# Patient Record
Sex: Male | Born: 1970 | Race: White | Hispanic: No | Marital: Married | Smoking: Never smoker
Health system: Southern US, Community
[De-identification: ages and names within clinical notes are randomized; demographics above are authoritative.]

## PROBLEM LIST (undated history)

## (undated) DIAGNOSIS — K219 Gastro-esophageal reflux disease without esophagitis: Secondary | ICD-10-CM

## (undated) HISTORY — DX: Gastro-esophageal reflux disease without esophagitis: K21.9

---

## 2012-09-02 ENCOUNTER — Encounter: Payer: Self-pay | Admitting: Physician Assistant

## 2012-09-02 ENCOUNTER — Ambulatory Visit (INDEPENDENT_AMBULATORY_CARE_PROVIDER_SITE_OTHER): Payer: BC Managed Care – PPO | Admitting: Physician Assistant

## 2012-09-02 ENCOUNTER — Telehealth: Payer: Self-pay | Admitting: Radiology

## 2012-09-02 VITALS — BP 127/82 | HR 89 | Temp 98.7°F | Resp 16 | Ht 69.0 in | Wt 240.2 lb

## 2012-09-02 DIAGNOSIS — Z0000 Encounter for general adult medical examination without abnormal findings: Secondary | ICD-10-CM

## 2012-09-02 DIAGNOSIS — K219 Gastro-esophageal reflux disease without esophagitis: Secondary | ICD-10-CM

## 2012-09-02 DIAGNOSIS — R635 Abnormal weight gain: Secondary | ICD-10-CM

## 2012-09-02 DIAGNOSIS — E785 Hyperlipidemia, unspecified: Secondary | ICD-10-CM

## 2012-09-02 DIAGNOSIS — Z125 Encounter for screening for malignant neoplasm of prostate: Secondary | ICD-10-CM

## 2012-09-02 LAB — POCT CBC
HCT, POC: 48 % (ref 43.5–53.7)
Hemoglobin: 15.5 g/dL (ref 14.1–18.1)
Lymph, poc: 3.6 — AB (ref 0.6–3.4)
MCH, POC: 29.3 pg (ref 27–31.2)
MCHC: 32.3 g/dL (ref 31.8–35.4)
MPV: 9.8 fL (ref 0–99.8)
POC LYMPH PERCENT: 40.2 %L (ref 10–50)
POC MID %: 8.1 %M (ref 0–12)
WBC: 9 10*3/uL (ref 4.6–10.2)

## 2012-09-02 LAB — LIPID PANEL
Cholesterol: 246 mg/dL — ABNORMAL HIGH (ref 0–200)
Total CHOL/HDL Ratio: 7.7 Ratio
Triglycerides: 639 mg/dL — ABNORMAL HIGH (ref <150)

## 2012-09-02 LAB — COMPREHENSIVE METABOLIC PANEL
CO2: 29 mEq/L (ref 19–32)
Creat: 1.07 mg/dL (ref 0.50–1.35)
Glucose, Bld: 103 mg/dL — ABNORMAL HIGH (ref 70–99)
Total Bilirubin: 0.7 mg/dL (ref 0.3–1.2)

## 2012-09-02 NOTE — Telephone Encounter (Signed)
Pt was to have meds sent in, but not sure what, will ask Marylene Land and call him back.

## 2012-09-03 ENCOUNTER — Encounter: Payer: Self-pay | Admitting: Physician Assistant

## 2012-09-03 ENCOUNTER — Telehealth: Payer: Self-pay | Admitting: Radiology

## 2012-09-03 DIAGNOSIS — K219 Gastro-esophageal reflux disease without esophagitis: Secondary | ICD-10-CM | POA: Insufficient documentation

## 2012-09-03 MED ORDER — OMEPRAZOLE 20 MG PO CPDR: 20.0000 mg | DELAYED_RELEASE_CAPSULE | Freq: Every day | ORAL | Status: DC

## 2012-09-03 NOTE — Patient Instructions (Signed)
Healthy diet and exercise recommended as well as h/o for age given.

## 2012-09-03 NOTE — Progress Notes (Signed)
  Subjective:    Patient ID: Zachary Shelton, male    DOB: 02-24-1971, 42 y.o.   MRN: [id number]  HPI patient here for CPE.  He denies any problems currently.  His PMH, SH,SxH updated and reviewed. He admits to having had about a 30 pound weight gain over the last year and a half. He knows this is due to poor eating habits and not exercising enough. His cholesterol has been high in the past.  Review of Systems  All other systems reviewed and are negative.      Objective:   Physical Exam  Nursing note and vitals reviewed. Constitutional: He is oriented to person, place, and time. He appears well-developed and well-nourished.       Obese male  HENT:  Head: Normocephalic and atraumatic.  Right Ear: External ear normal.  Left Ear: External ear normal.  Nose: Nose normal.  Mouth/Throat: Oropharynx is clear and moist. No oropharyngeal exudate.       mallampati 2-3  Eyes: Conjunctivae normal and EOM are normal. Pupils are equal, round, and reactive to light. Right eye exhibits no discharge. Left eye exhibits no discharge.       Fundi benign  Neck: Normal range of motion. Neck supple. No JVD present. No thyromegaly present.  Cardiovascular: Normal rate, regular rhythm, normal heart sounds and intact distal pulses.  Exam reveals no gallop and no friction rub.   No murmur heard. Pulmonary/Chest: Effort normal and breath sounds normal.  Abdominal: Soft. Bowel sounds are normal.       Exam limited by obesity  Musculoskeletal: Normal range of motion.  Lymphadenopathy:    He has no cervical adenopathy.  Neurological: He is alert and oriented to person, place, and time. He has normal reflexes. No cranial nerve deficit.  Skin: Skin is warm and dry.  Psychiatric: He has a normal mood and affect. His behavior is normal.      Assessment & Plan:  CPE for age-normal exam except obesity Hypercholesterolemia-checking labs GERD-stable on omeprazole-it is fine for him to continue this at his current  dose. mallampati 2-3 and he snores.  We discussed sleep apnea.  He declines workup at this time.

## 2012-09-03 NOTE — Telephone Encounter (Signed)
I spoke to patient yesterday, he was seen ,his meds not sent in what is he to have?

## 2012-09-03 NOTE — Telephone Encounter (Signed)
Sorry, I sent it this afternoon!

## 2012-09-04 NOTE — Telephone Encounter (Signed)
Spoke to pt advised RX at pharmacy.

## 2013-05-19 ENCOUNTER — Ambulatory Visit (INDEPENDENT_AMBULATORY_CARE_PROVIDER_SITE_OTHER): Payer: BC Managed Care – PPO | Admitting: Family Medicine

## 2013-05-19 VITALS — BP 118/76 | HR 70 | Temp 98.3°F | Resp 14 | Ht 69.0 in | Wt 235.0 lb

## 2013-05-19 DIAGNOSIS — R109 Unspecified abdominal pain: Secondary | ICD-10-CM

## 2013-05-19 DIAGNOSIS — R110 Nausea: Secondary | ICD-10-CM

## 2013-05-19 DIAGNOSIS — K219 Gastro-esophageal reflux disease without esophagitis: Secondary | ICD-10-CM

## 2013-05-19 LAB — POCT CBC
Granulocyte percent: 58.6 % (ref 37–80)
HCT, POC: 41.1 % — AB (ref 43.5–53.7)
Hemoglobin: 13.1 g/dL — AB (ref 14.1–18.1)
Lymph, poc: 2.3 (ref 0.6–3.4)
MCH, POC: 29.4 pg (ref 27–31.2)
MCHC: 31.9 g/dL (ref 31.8–35.4)
MCV: 92.3 fL (ref 80–97)
MID (cbc): 0.5 (ref 0–0.9)
MPV: 9.1 fL (ref 0–99.8)
POC Granulocyte: 4 (ref 2–6.9)
POC LYMPH PERCENT: 34.2 % (ref 10–50)
POC MID %: 7.2 % (ref 0–12)
Platelet Count, POC: 192 K/uL (ref 142–424)
RBC: 4.45 M/uL — AB (ref 4.69–6.13)
RDW, POC: 13.9 %
WBC: 6.8 K/uL (ref 4.6–10.2)

## 2013-05-19 LAB — POCT UA - MICROSCOPIC ONLY
Mucus, UA: NEGATIVE
Yeast, UA: NEGATIVE

## 2013-05-19 LAB — POCT URINALYSIS DIPSTICK
Bilirubin, UA: NEGATIVE
Blood, UA: NEGATIVE
Glucose, UA: NEGATIVE
Nitrite, UA: NEGATIVE

## 2013-05-19 NOTE — Progress Notes (Deleted)
Subjective:    Patient ID: Zachary Shelton, male    DOB: [birth year], 42 y.o.   MRN: [id number]  This chart was scribed for Meredith Staggers, MD by Greggory Stallion, ED Scribe. This patient's care was started at 6:02 PM.  HPI HPI Comments: Zachary Shelton is a 42 y.o. male who presents to the office complaining of intermittent cramping abdominal pain that started 4- 5 days ago - on and off, but more cramping since last nt.  Episodes occur every 5-10 minutes, noted after dinner. He states it started with chills and nausea 4 days ago but those symptoms resolved 2 days later. Pt states he was at [facility] when the symptoms started. He states he was drinking more alcohol than normal few days prior, and had an episode of emesis because of it. In the past he would normally have 1-2 drinks per day but has cut down in the last few months. Pt has been having normal bowel movements. Pt denies difficulty urinating, hematuria, fever, chest pain, nausea, emesis, diarrhea, dark tarry stool, hematochezia. Pt denies smoking cigarettes and recreational drug use. His wife is currently sick with URI symptoms but he has no other sick contacts with GI illness.  Patient Active Problem List   Diagnosis Date Noted   GERD (gastroesophageal reflux disease) 09/03/2012   Past Medical History  Diagnosis Date   GERD (gastroesophageal reflux disease)    History reviewed. No pertinent past surgical history. No Known Allergies Prior to Admission medications   Medication Sig Start Date End Date Taking? Authorizing Provider  omeprazole (PRILOSEC) 20 MG capsule Take 1 capsule (20 mg total) by mouth daily. 09/03/12  Yes Anders Simmonds, PA-C   History   Social History   Marital Status: Married    Spouse Name: N/A    Number of Children: N/A   Years of Education: N/A   Occupational History   Not on file.   Social History Main Topics   Smoking status: Never Smoker    Smokeless tobacco: Not on file   Alcohol Use:  0.6 oz/week    1 Glasses of wine per week   Drug Use: No   Sexual Activity: Not on file   Other Topics Concern   Not on file   Social History Narrative   No narrative on file    Review of Systems  Constitutional: Negative for fever.  Cardiovascular: Negative for chest pain.  Gastrointestinal: Positive for abdominal pain. Negative for nausea, vomiting, diarrhea and blood in stool.  Genitourinary: Negative for hematuria and difficulty urinating.       Objective:   Physical Exam  Constitutional: He is oriented to person, place, and time. He appears well-developed and well-nourished. No distress.  HENT:  Head: Normocephalic and atraumatic.  Neck: Normal range of motion.  Cardiovascular: Normal rate, regular rhythm and normal heart sounds.   Pulmonary/Chest: Effort normal and breath sounds normal. No respiratory distress. He has no wheezes. He has no rales.  Abdominal: Soft. Bowel sounds are normal. There is tenderness (minima on repeat exam, no guarding. ) in the suprapubic area. There is no rigidity, no rebound, no guarding, no CVA tenderness, no tenderness at McBurney's point and negative Murphy's sign. No hernia.  Feels some soreness towards the back with palpation of abdomen.   Neurological: He is alert and oriented to person, place, and time.  Skin: Skin is warm and dry. He is not diaphoretic.  Psychiatric: He has a normal mood and affect. His behavior is normal.  Filed Vitals:   05/19/13 1656  BP: 118/76  Pulse: 70  Temp: 98.3 F (36.8 C)  Resp: 14  Height: 5\' 9"  (1.753 m)  Weight: 235 lb (106.595 kg)  SpO2: 96%       Assessment & Plan:  Zachary Shelton is a 42 y.o. male   I personally performed the services described in this documentation, which was scribed in my presence. The recorded information has been reviewed and considered, and addended by me as needed.

## 2013-05-19 NOTE — Progress Notes (Signed)
Patient ID: Zachary Shelton, male    DOB: 11/04/1970, 42 y.o.   MRN: [id number]   This chart was scribed for Meredith Staggers, MD by Greggory Stallion, ED Scribe. This patient's care was started at 6:02 PM.   HPI HPI Comments: Zachary Shelton is a 42 y.o. male who presents to the office complaining of intermittent cramping abdominal pain that started last night. Episodes occur every 5-10 minutes. He states it started with chills and nausea 4 days ago but those symptoms resolved 2 days later. Pt states he was at [facility] when the symptoms started. He states he was drinking more alcohol than normal and had an episode of emesis because of it. In the past he would normally have 1-2 drinks per day but has cut down in the last few months. Pt has been having normal bowel movements. Pt denies difficulty urinating, hematuria, fever, chest pain, nausea, emesis, diarrhea, dark tarry stool, hematochezia. Pt denies smoking cigarettes and recreational drug use. His wife is currently sick with URI symptoms but he has no other sick contacts.   Later in visit - addl history admits to, alleve, alka seltzer use, and possible blood in vomit last week.     Patient Active Problem List     Diagnosis  Date Noted   .  GERD (gastroesophageal reflux disease)  09/03/2012    Past Medical History   Diagnosis  Date   .  GERD (gastroesophageal reflux disease)      History reviewed. No pertinent past surgical history. No Known Allergies Prior to Admission medications    Medication  Sig  Start Date  End Date  Taking?  Authorizing Provider   omeprazole (PRILOSEC) 20 MG capsule  Take 1 capsule (20 mg total) by mouth daily.  09/03/12    Yes  Anders Simmonds, PA-C       History       Social History   .  Marital Status:  Married       Spouse Name:  N/A       Number of Children:  N/A   .  Years of Education:  N/A       Occupational History   .  Not on file.       Social History Main Topics   .  Smoking status:   Never Smoker    .  Smokeless tobacco:  Not on file   .  Alcohol Use:  0.6 oz/week       1 Glasses of wine per week   .  Drug Use:  No   .  Sexual Activity:  Not on file       Other Topics  Concern   .  Not on file       Social History Narrative   .  No narrative on file      Review of Systems  Constitutional: Negative for fever.  Cardiovascular: Negative for chest pain.  Gastrointestinal: Positive for abdominal pain. Negative for nausea, vomiting, diarrhea and blood in stool.  Genitourinary: Negative for hematuria and difficulty urinating.      Objective:    Physical Exam  Constitutional: He is oriented to person, place, and time. He appears well-developed and well-nourished. No distress.  HENT:   Head: Normocephalic and atraumatic.  Neck: Normal range of motion.  Cardiovascular: Normal rate, regular rhythm and normal heart sounds.   Pulmonary/Chest: Effort normal and breath sounds normal. No respiratory distress. He has no wheezes. He has no rales.  Abdominal: Soft. There is no tenderness.  Feels some soreness towards the back with palpation of abdomen.   Neurological: He is alert and oriented to person, place, and time.  Skin: Skin is warm and dry. He is not diaphoretic.  Psychiatric: He has a normal mood and affect. His behavior is normal.   Filed Vitals:   05/19/13 1656  BP: 118/76  Pulse: 70  Temp: 98.3 F (36.8 C)  Resp: 14  Height: 5\' 9"  (1.753 m)  Weight: 235 lb (106.595 kg)  SpO2: 96%       Filed Vitals:     05/19/13 1656   BP:  118/76   Pulse:  70   Temp:  98.3 F (36.8 C)   Resp:  14   Height:  5\' 9"  (1.753 m)   Weight:  235 lb (106.595 kg)   SpO2:  96%     HGB 15.5 in January. hemosure collected with rectal exam - no gross blood, minimal brown stool on test.    Results for orders placed in visit on 05/19/13  POCT CBC      Result Value Range   WBC 6.8  4.6 - 10.2 K/uL   Lymph, poc 2.3  0.6 - 3.4   POC LYMPH PERCENT 34.2  10 - 50 %L    MID (cbc) 0.5  0 - 0.9   POC MID % 7.2  0 - 12 %M   POC Granulocyte 4.0  2 - 6.9   Granulocyte percent 58.6  37 - 80 %G   RBC 4.45 (*) 4.69 - 6.13 M/uL   Hemoglobin 13.1 (*) 14.1 - 18.1 g/dL   HCT, POC 40.9 (*) 81.1 - 53.7 %   MCV 92.3  80 - 97 fL   MCH, POC 29.4  27 - 31.2 pg   MCHC 31.9  31.8 - 35.4 g/dL   RDW, POC 91.4     Platelet Count, POC 192  142 - 424 K/uL   MPV 9.1  0 - 99.8 fL  POCT UA - MICROSCOPIC ONLY      Result Value Range   WBC, Ur, HPF, POC 1-2     RBC, urine, microscopic 0-1     Bacteria, U Microscopic trace     Mucus, UA neg     Epithelial cells, urine per micros 1-2     Crystals, Ur, HPF, POC neg     Casts, Ur, LPF, POC neg     Yeast, UA neg    POCT URINALYSIS DIPSTICK      Result Value Range   Color, UA yellow     Clarity, UA clear     Glucose, UA neg     Bilirubin, UA neg     Ketones, UA neg     Spec Grav, UA 1.020     Blood, UA neg     pH, UA 6.0     Protein, UA neg     Urobilinogen, UA 1.0     Nitrite, UA neg     Leukocytes, UA Negative    IFOBT (OCCULT BLOOD)      Result Value Range   IFOBT Negative     Assessment & Plan:   Zachary Shelton is a 42 y.o. male Nausea - Plan: POCT CBC, Lipase, POCT UA - Microscopic Only, POCT urinalysis dipstick  Abdominal pain - Plan: POCT CBC, Lipase, POCT UA - Microscopic Only, POCT urinalysis dipstick, Comprehensive metabolic panel, IFOBT POC (occult bld, rslt in office)  GERD (gastroesophageal reflux  disease) - Plan: POCT CBC, Lipase, POCT UA - Microscopic Only, POCT urinalysis dipstick, Comprehensive metabolic panel  Intermittent epigastric abd pain - reassuring exam and WBC on CBC, but addl hx of NSAID use, and possible blood in initial vomitus, with slight anemia - DDX PUD, and with prior ETOH - pancreatitis, but minimal ttp on exam. Heme negative stool in office. Check lipase, CMP, increase omeprazole to 20mg  BID or 40mg  qd, and trigger avoidance as below.  Plan on recheck with possible repeat CBC in 2  days. Rtc/er precautions discussed and understanding expressed.   No orders of the defined types were placed in this encounter.   Patient Instructions  We sent off tests for pancreas and liver.   Increase the omeprazole to twice per day for now and avoid aspirin, NSAIDS and alcohol. Avoid spicy and fried foods as well. You should receive a call or letter about your lab results within the next week to 10 days, but recheck in 2 days to repeat blood count. Return to the clinic or go to the nearest emergency room if any of your symptoms worsen or new symptoms occur. Abdominal Pain Abdominal pain can be caused by many things. Your caregiver decides the seriousness of your pain by an examination and possibly blood tests and X-rays. Many cases can be observed and treated at home. Most abdominal pain is not caused by a disease and will probably improve without treatment. However, in many cases, more time must pass before a clear cause of the pain can be found. Before that point, it may not be known if you need more testing, or if hospitalization or surgery is needed. HOME CARE INSTRUCTIONS   Do not take laxatives unless directed by your caregiver.  Take pain medicine only as directed by your caregiver.  Only take over-the-counter or prescription medicines for pain, discomfort, or fever as directed by your caregiver.  Try a clear liquid diet (broth, tea, or water) for as long as directed by your caregiver. Slowly move to a bland diet as tolerated. SEEK IMMEDIATE MEDICAL CARE IF:   The pain does not go away.  You have a fever.  You keep throwing up (vomiting).  The pain is felt only in portions of the abdomen. Pain in the right side could possibly be appendicitis. In an adult, pain in the left lower portion of the abdomen could be colitis or diverticulitis.  You pass bloody or black tarry stools. MAKE SURE YOU:   Understand these instructions.  Will watch your condition.  Will get help right  away if you are not doing well or get worse. Document Released: 05/01/2005 Document Revised: 10/14/2011 Document Reviewed: 03/09/2008 [facility] Patient Information 2014 [place], [place].

## 2013-05-19 NOTE — Patient Instructions (Addendum)
We sent off tests for pancreas and liver.   Increase the omeprazole to twice per day for now and avoid aspirin, NSAIDS and alcohol. Avoid spicy and fried foods as well. You should receive a call or letter about your lab results within the next week to 10 days, but recheck in 2 days to repeat blood count. Return to the clinic or go to the nearest emergency room if any of your symptoms worsen or new symptoms occur. Abdominal Pain Abdominal pain can be caused by many things. Your caregiver decides the seriousness of your pain by an examination and possibly blood tests and X-rays. Many cases can be observed and treated at home. Most abdominal pain is not caused by a disease and will probably improve without treatment. However, in many cases, more time must pass before a clear cause of the pain can be found. Before that point, it may not be known if you need more testing, or if hospitalization or surgery is needed. HOME CARE INSTRUCTIONS   Do not take laxatives unless directed by your caregiver.  Take pain medicine only as directed by your caregiver.  Only take over-the-counter or prescription medicines for pain, discomfort, or fever as directed by your caregiver.  Try a clear liquid diet (broth, tea, or water) for as long as directed by your caregiver. Slowly move to a bland diet as tolerated. SEEK IMMEDIATE MEDICAL CARE IF:   The pain does not go away.  You have a fever.  You keep throwing up (vomiting).  The pain is felt only in portions of the abdomen. Pain in the right side could possibly be appendicitis. In an adult, pain in the left lower portion of the abdomen could be colitis or diverticulitis.  You pass bloody or black tarry stools. MAKE SURE YOU:   Understand these instructions.  Will watch your condition.  Will get help right away if you are not doing well or get worse. Document Released: 05/01/2005 Document Revised: 10/14/2011 Document Reviewed: 03/09/2008 [facility] Patient  Information 2014 [employer], [place].

## 2013-05-20 LAB — COMPREHENSIVE METABOLIC PANEL
ALT: 34 U/L (ref 0–53)
AST: 20 U/L (ref 0–37)
Albumin: 4.6 g/dL (ref 3.5–5.2)
Alkaline Phosphatase: 62 U/L (ref 39–117)
BUN: 15 mg/dL (ref 6–23)
Creat: 0.95 mg/dL (ref 0.50–1.35)
Glucose, Bld: 94 mg/dL (ref 70–99)
Total Bilirubin: 0.4 mg/dL (ref 0.3–1.2)
Total Protein: 6.9 g/dL (ref 6.0–8.3)

## 2013-05-20 LAB — LIPASE: Lipase: 26 U/L (ref 0–75)

## 2013-05-21 ENCOUNTER — Telehealth: Payer: Self-pay

## 2013-05-21 ENCOUNTER — Ambulatory Visit: Payer: BC Managed Care – PPO

## 2013-05-21 NOTE — Telephone Encounter (Signed)
DR Neva Seat   PATIENT WOULD LIKE TO TALK TO YOU REGARDING HIS LABS PLEASE CALL HIM AT [phone]

## 2013-05-21 NOTE — Telephone Encounter (Signed)
Left message, what are his questions?

## 2013-05-22 ENCOUNTER — Ambulatory Visit: Payer: BC Managed Care – PPO | Admitting: Family Medicine

## 2013-05-22 VITALS — BP 102/70 | HR 63 | Temp 98.0°F | Resp 16 | Ht 69.0 in | Wt 235.0 lb

## 2013-05-22 DIAGNOSIS — D649 Anemia, unspecified: Secondary | ICD-10-CM

## 2013-05-22 LAB — POCT CBC
Granulocyte percent: 64.8 %G (ref 37–80)
HCT, POC: 45 % (ref 43.5–53.7)
Hemoglobin: 14.6 g/dL (ref 14.1–18.1)
Lymph, poc: 3 (ref 0.6–3.4)
MCH, POC: 30 pg (ref 27–31.2)
MCHC: 32.4 g/dL (ref 31.8–35.4)
MCV: 92.4 fL (ref 80–97)
MID (cbc): 0.5 (ref 0–0.9)
MPV: 9.2 fL (ref 0–99.8)
POC Granulocyte: 6.5 (ref 2–6.9)
POC LYMPH PERCENT: 29.7 %L (ref 10–50)
POC MID %: 5.5 %M (ref 0–12)
Platelet Count, POC: 255 10*3/uL (ref 142–424)
RBC: 4.87 M/uL (ref 4.69–6.13)
RDW, POC: 13.5 %
WBC: 10 10*3/uL (ref 4.6–10.2)

## 2013-05-22 NOTE — Telephone Encounter (Signed)
Pt currently here to be seen

## 2013-05-22 NOTE — Progress Notes (Signed)
  Subjective:  This chart was scribed for Zachary Sidle, MD by Arlan Organ, ED Scribe. This patient was seen in room Room 8 and the patient's care was started 9:02 AM.   Patient ID: Zachary Shelton, male    DOB: [birth year], 42 y.o.   MRN: [id number]  HPI HPI Comments: Zachary Shelton is a 42 y.o. male who presents to [facility] seeking follow up for his anemia today. Pt states he was here at [facility] on 10/15 for abdominal pain, and was told he had anemia. Pt states he was told to come in for a follow up per Dr. Neva Seat. 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   Pt requesting phone call for results if out of range.   Review of Systems Neg for fever, pain, weakness or fatigue    Objective:   Physical Exam  Patient in no distress Results for orders placed in visit on 05/22/13  POCT CBC      Result Value Range   WBC 10.0  4.6 - 10.2 K/uL   Lymph, poc 3.0  0.6 - 3.4   POC LYMPH PERCENT 29.7  10 - 50 %L   MID (cbc) 0.5  0 - 0.9   POC MID % 5.5  0 - 12 %M   POC Granulocyte 6.5  2 - 6.9   Granulocyte percent 64.8  37 - 80 %G   RBC 4.87  4.69 - 6.13 M/uL   Hemoglobin 14.6  14.1 - 18.1 g/dL   HCT, POC 40.9  81.1 - 53.7 %   MCV 92.4  80 - 97 fL   MCH, POC 30.0  27 - 31.2 pg   MCHC 32.4  31.8 - 35.4 g/dL   RDW, POC 91.4     Platelet Count, POC 255  142 - 424 K/uL   MPV 9.2  0 - 99.8 fL       Assessment & Plan:  Mild anemia, transient, resolved.  No further action necessary

## 2013-09-23 ENCOUNTER — Ambulatory Visit (INDEPENDENT_AMBULATORY_CARE_PROVIDER_SITE_OTHER): Payer: BC Managed Care – PPO | Admitting: Family Medicine

## 2013-09-23 ENCOUNTER — Encounter: Payer: Self-pay | Admitting: Family Medicine

## 2013-09-23 VITALS — BP 128/82 | HR 76 | Temp 98.3°F | Resp 16 | Ht 68.0 in | Wt 235.4 lb

## 2013-09-23 DIAGNOSIS — Z0000 Encounter for general adult medical examination without abnormal findings: Secondary | ICD-10-CM

## 2013-09-23 DIAGNOSIS — K219 Gastro-esophageal reflux disease without esophagitis: Secondary | ICD-10-CM

## 2013-09-23 DIAGNOSIS — E785 Hyperlipidemia, unspecified: Secondary | ICD-10-CM

## 2013-09-23 DIAGNOSIS — E669 Obesity, unspecified: Secondary | ICD-10-CM | POA: Insufficient documentation

## 2013-09-23 DIAGNOSIS — E781 Pure hyperglyceridemia: Secondary | ICD-10-CM | POA: Insufficient documentation

## 2013-09-23 LAB — COMPREHENSIVE METABOLIC PANEL
ALBUMIN: 5 g/dL (ref 3.5–5.2)
ALK PHOS: 48 U/L (ref 39–117)
ALT: 36 U/L (ref 0–53)
AST: 21 U/L (ref 0–37)
BUN: 18 mg/dL (ref 6–23)
CALCIUM: 9.3 mg/dL (ref 8.4–10.5)
CO2: 27 meq/L (ref 19–32)
Chloride: 100 mEq/L (ref 96–112)
Creat: 0.94 mg/dL (ref 0.50–1.35)
Glucose, Bld: 96 mg/dL (ref 70–99)
POTASSIUM: 4.2 meq/L (ref 3.5–5.3)
SODIUM: 137 meq/L (ref 135–145)
TOTAL PROTEIN: 7.3 g/dL (ref 6.0–8.3)
Total Bilirubin: 0.4 mg/dL (ref 0.2–1.2)

## 2013-09-23 LAB — POCT URINALYSIS DIPSTICK
BILIRUBIN UA: NEGATIVE
Blood, UA: NEGATIVE
Glucose, UA: NEGATIVE
LEUKOCYTES UA: NEGATIVE
NITRITE UA: NEGATIVE
PH UA: 5.5
Spec Grav, UA: 1.015
Urobilinogen, UA: 0.2

## 2013-09-23 LAB — LIPID PANEL
CHOLESTEROL: 208 mg/dL — AB (ref 0–200)
HDL: 31 mg/dL — ABNORMAL LOW (ref >39)
Total CHOL/HDL Ratio: 6.7 Ratio
Triglycerides: 544 mg/dL — ABNORMAL HIGH (ref <150)

## 2013-09-23 LAB — POCT GLYCOSYLATED HEMOGLOBIN (HGB A1C): HEMOGLOBIN A1C: 5.2

## 2013-09-23 MED ORDER — OMEPRAZOLE 20 MG PO CPDR: 20.0000 mg | DELAYED_RELEASE_CAPSULE | Freq: Every day | ORAL | Status: DC

## 2013-09-23 NOTE — Progress Notes (Signed)
Physical examination:  History: 43 year old man who is here for his annual physical exam. He needs a form completed for his job place stating that he has had a physical. He has no major acute medical complaints.  Past medical history: Operations: None Medical illnesses: No major illnesses. Has a history of some GERD and of high lipids, both cholesterol and triglycerides Medication allergies: None Medications: Omeprazole  Family history: Parents are both living and well. He has 2 brothers who are also living and well. No major familial disease reported  Social history: Patient is married and has 2 young children. 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 He has college education. He is not smoke or use drugs. He does drink 4 or 5 drinks a week. He gets some exercise with sporting and other more in the warmer weather. He does some walking of his dog.  Review of systems: Constitutional: Unremarkable HEENT: Unremarkable Respiratory: Unremarkable Cardiovascular: Unremarkable Gastrointestinal: GERD, fairly well controlled if he watches what he said. He has been working on eating less and try to lose some weight Genitourinary: Unremarkable Endocrine: Unremarkable Musculoskeletal: Unremarkable Dermatologic: Unremarkable Immunologic: Unremarkable Neurologic: Unremarkable Hematologic: Unremarkable Psychiatric: Unremarkable  Physical exam: Moderately overweight white male in no major distress. TMs are normal. Eyes PERR L. EOMs intact. Throat clear. Neck supple without nodes or thyromegaly. No carotid bruits. Chest is clear to auscultation. Heart regular without murmurs. Abdomen soft without masses or tenderness. Normal male external genitalia. No testicular masses. No hernias. Digital rectal exam is reveals prostate gland to be normal in size and contour. He has a small skin tag to the left side of his anus on the buttock. Skin otherwise fairly unremarkable. Pedal pulses are  good.  Assessment: Normal annual physical examination Overweight Hyperlipidemic History of GERD  Plan: Check hemoglobin A1c, urinalysis, CMP, lipids Return annually Wil decide if he needs to go on lipid-lowering medication after get the results of his labs back. Urged him to keep working harder on his weight loss and regular exercise and eating less Refill his omeprazole   Results for orders placed in visit on 09/23/13  POCT GLYCOSYLATED HEMOGLOBIN (HGB A1C)      Result Value Ref Range   Hemoglobin A1C 5.2    POCT URINALYSIS DIPSTICK      Result Value Ref Range   Color, UA yellow     Clarity, UA clear     Glucose, UA neg     Bilirubin, UA neg     Ketones, UA 15mg      Spec Grav, UA 1.015     Blood, UA neg     pH, UA 5.5     Protein, UA Trace     Urobilinogen, UA 0.2     Nitrite, UA neg     Leukocytes, UA Negative

## 2013-09-23 NOTE — Patient Instructions (Signed)
Getting exercise and working yet weight loss as discussed  I will let you know the results of your labs  Return if problems, otherwise annual checkup unless told otherwise after we receive your labs.

## 2013-09-26 ENCOUNTER — Other Ambulatory Visit: Payer: Self-pay | Admitting: Physician Assistant

## 2015-03-06 ENCOUNTER — Ambulatory Visit (INDEPENDENT_AMBULATORY_CARE_PROVIDER_SITE_OTHER): Payer: BLUE CROSS/BLUE SHIELD | Admitting: Family Medicine

## 2015-03-06 VITALS — BP 126/84 | HR 74 | Temp 98.8°F | Resp 16 | Ht 68.0 in | Wt 244.4 lb

## 2015-03-06 DIAGNOSIS — S3730XA Unspecified injury of urethra, initial encounter: Secondary | ICD-10-CM | POA: Diagnosis not present

## 2015-03-06 DIAGNOSIS — R300 Dysuria: Secondary | ICD-10-CM | POA: Diagnosis not present

## 2015-03-06 LAB — POCT URINALYSIS DIPSTICK
Bilirubin, UA: NEGATIVE
GLUCOSE UA: NEGATIVE
Ketones, UA: NEGATIVE
Leukocytes, UA: NEGATIVE
Nitrite, UA: NEGATIVE
Protein, UA: NEGATIVE
Spec Grav, UA: 1.02
Urobilinogen, UA: 1
pH, UA: 7

## 2015-03-06 LAB — POCT UA - MICROSCOPIC ONLY
CASTS, UR, LPF, POC: NEGATIVE
CRYSTALS, UR, HPF, POC: NEGATIVE
Mucus, UA: NEGATIVE
Yeast, UA: NEGATIVE

## 2015-03-06 MED ORDER — LIDOCAINE 5 % EX OINT: 1.0000 "application " | TOPICAL_OINTMENT | CUTANEOUS | Status: DC | PRN

## 2015-03-06 MED ORDER — OXYCODONE-ACETAMINOPHEN 5-325 MG PO TABS: 1.0000 | ORAL_TABLET | Freq: Three times a day (TID) | ORAL | Status: DC | PRN

## 2015-03-06 NOTE — Patient Instructions (Signed)

## 2015-03-06 NOTE — Progress Notes (Signed)
Subjective:    Patient ID: Zachary Shelton, male    DOB: May 11, 1971, 44 y.o.   MRN: [id number] This chart was scribed for Zachary Sorenson, MD by Jolene Provost, Medical Scribe. This patient was seen in Room 13 and the patient's care was started a 9:46 PM.  Chief Complaint  Patient presents with  . Dysuria    today    HPI HPI Comments: Zachary Shelton is a 44 y.o. male who presents to [facility] complaining of severe pain with urination that started suddenly this afternoon. Pt denies flank pain, rash, abdominal pain, urinary hesitancy, nausea, vomiting, fever or chills. Urine darker in color. Pt has not drank much water. Pt has not had STI testing. Pt is in a monogamous relationship. Pt denies nocturia. Pt's bowel movements have been normal. No family hx of kidney stones.    Past Medical History  Diagnosis Date  . GERD (gastroesophageal reflux disease)    No Known Allergies Current Outpatient Prescriptions on File Prior to Visit  Medication Sig Dispense Refill  . omeprazole (PRILOSEC) 20 MG capsule Take 1 capsule (20 mg total) by mouth daily. 90 capsule 3   No current facility-administered medications on file prior to visit.   Depression screen PHQ 2/9 03/06/2015  Decreased Interest 0  Down, Depressed, Hopeless 0  PHQ - 2 Score 0      Review of Systems  Constitutional: Negative for fever, chills, activity change, appetite change and unexpected weight change.  Gastrointestinal: Negative for nausea, vomiting, abdominal pain, diarrhea, constipation, abdominal distention and rectal pain.  Endocrine: Negative for polydipsia, polyphagia and polyuria.  Genitourinary: Positive for dysuria, hematuria, genital sores and penile pain. Negative for urgency, frequency, flank pain, decreased urine volume, discharge, penile swelling, scrotal swelling, enuresis, difficulty urinating and testicular pain.  Musculoskeletal: Negative for back pain.  Hematological: Negative for adenopathy.    Psychiatric/Behavioral: Negative for dysphoric mood.       Objective:  BP 126/84 mmHg  Pulse 74  Temp(Src) 98.8 F (37.1 C) (Oral)  Resp 16  Ht  (1.727 m)  Wt 244 lb 6.4 oz (110.859 kg)  BMI 37.17 kg/m2  SpO2 98%  Physical Exam  Constitutional: He is oriented to person, place, and time. He appears well-developed and well-nourished. No distress.  HENT:  Head: Normocephalic and atraumatic.  Eyes: Pupils are equal, round, and reactive to light.  Neck: Neck supple.  Cardiovascular: Normal rate, regular rhythm and normal heart sounds.   No murmur heard. Normal S1-S2.  Pulmonary/Chest: Effort normal and breath sounds normal. No respiratory distress. He has no wheezes.  Abdominal: Soft. Bowel sounds are normal. There is no tenderness.  No CVA tenderness  Genitourinary: Prostate normal. Rectal exam shows no external hemorrhoid, no internal hemorrhoid, no mass and anal tone normal. Prostate is not enlarged and not tender. Circumcised. No phimosis, paraphimosis, hypospadias or penile tenderness. No discharge found.  Bony pelvis normal, no inguinal tenderness, no  adenopathy. No hepatosplenomegaly. Urethra mildly irritated at 7 o'clock, abrasion at 9 o'clock. Rectum and prostate normal.  Musculoskeletal: Normal range of motion.  Neurological: He is alert and oriented to person, place, and time. Coordination normal.  Skin: Skin is warm and dry. He is not diaphoretic.  Psychiatric: He has a normal mood and affect. His behavior is normal.  Nursing note and vitals reviewed.      Results for orders placed or performed in visit on 03/06/15  POCT UA - Microscopic Only  Result Value Ref Range   WBC, Ur,  HPF, POC 1-2    RBC, urine, microscopic 4-6    Bacteria, U Microscopic trace    Mucus, UA neg    Epithelial cells, urine per micros 1-3    Crystals, Ur, HPF, POC neg    Casts, Ur, LPF, POC neg    Yeast, UA neg   POCT urinalysis dipstick  Result Value Ref Range   Color, UA yellow     Clarity, UA clear    Glucose, UA neg    Bilirubin, UA neg    Ketones, UA neg    Spec Grav, UA 1.020    Blood, UA moderate    pH, UA 7.0    Protein, UA neg    Urobilinogen, UA 1.0    Nitrite, UA neg    Leukocytes, UA Negative Negative    Assessment & Plan:   1. Dysuria   2. Urethral injury, initial encounter - little abrasion immed inside urethra visible.  unsure of etiology - can't image anything other than physical abrasion (cauht in zipper or similar?????) or could be from passed kidney stone but seems less likely as pt did not have any renal collic or any sxs at all of a stone. Still will have pt strain urine as if he did find some sediment that would help make dx..  .   Increase water.  May need to consider spraying water onto urethra while voiding to help with pain.   Can try freq top lidocaine gel.  If sxs persist after 2-3d or worsen at all, RTC for further eval and cons urology referral    Orders Placed This Encounter  Procedures  . Urine culture  . GC/Chlamydia Probe Amp  . POCT UA - Microscopic Only  . POCT urinalysis dipstick    Meds ordered this encounter  Medications  . oxyCODONE-acetaminophen (ROXICET) 5-325 MG per tablet    Sig: Take 1 tablet by mouth every 8 (eight) hours as needed for severe pain.    Dispense:  15 tablet    Refill:  0  . lidocaine (XYLOCAINE) 5 % ointment    Sig: Apply 1 application topically every 4 (four) hours as needed.    Dispense:  50 g    Refill:  0    I personally performed the services described in this documentation, which was scribed in my presence. The recorded information has been reviewed and considered, and addended by me as needed.  Zachary Sorenson, MD MPH

## 2015-03-08 LAB — GC/CHLAMYDIA PROBE AMP
CT Probe RNA: NEGATIVE
GC Probe RNA: NEGATIVE

## 2015-03-08 LAB — URINE CULTURE
COLONY COUNT: NO GROWTH
Organism ID, Bacteria: NO GROWTH

## 2015-03-12 ENCOUNTER — Encounter: Payer: Self-pay | Admitting: Family Medicine

## 2015-09-07 ENCOUNTER — Other Ambulatory Visit: Payer: Self-pay | Admitting: Family Medicine

## 2015-09-07 ENCOUNTER — Ambulatory Visit (INDEPENDENT_AMBULATORY_CARE_PROVIDER_SITE_OTHER): Payer: BLUE CROSS/BLUE SHIELD | Admitting: Family Medicine

## 2015-09-07 ENCOUNTER — Encounter: Payer: Self-pay | Admitting: Family Medicine

## 2015-09-07 VITALS — BP 130/80 | HR 99 | Temp 99.2°F | Resp 16 | Ht 68.5 in | Wt 241.4 lb

## 2015-09-07 DIAGNOSIS — Z113 Encounter for screening for infections with a predominantly sexual mode of transmission: Secondary | ICD-10-CM | POA: Diagnosis not present

## 2015-09-07 DIAGNOSIS — Z1329 Encounter for screening for other suspected endocrine disorder: Secondary | ICD-10-CM | POA: Diagnosis not present

## 2015-09-07 DIAGNOSIS — Z1383 Encounter for screening for respiratory disorder NEC: Secondary | ICD-10-CM | POA: Diagnosis not present

## 2015-09-07 DIAGNOSIS — Z130 Encounter for screening for diseases of the blood and blood-forming organs and certain disorders involving the immune mechanism: Secondary | ICD-10-CM

## 2015-09-07 DIAGNOSIS — K219 Gastro-esophageal reflux disease without esophagitis: Secondary | ICD-10-CM | POA: Diagnosis not present

## 2015-09-07 DIAGNOSIS — E669 Obesity, unspecified: Secondary | ICD-10-CM | POA: Diagnosis not present

## 2015-09-07 DIAGNOSIS — Z1389 Encounter for screening for other disorder: Secondary | ICD-10-CM | POA: Diagnosis not present

## 2015-09-07 DIAGNOSIS — E781 Pure hyperglyceridemia: Secondary | ICD-10-CM | POA: Diagnosis not present

## 2015-09-07 DIAGNOSIS — Z0000 Encounter for general adult medical examination without abnormal findings: Secondary | ICD-10-CM

## 2015-09-07 DIAGNOSIS — Z136 Encounter for screening for cardiovascular disorders: Secondary | ICD-10-CM

## 2015-09-07 LAB — POC MICROSCOPIC URINALYSIS (UMFC): Mucus: ABSENT

## 2015-09-07 LAB — COMPREHENSIVE METABOLIC PANEL
ALT: 41 U/L (ref 9–46)
AST: 22 U/L (ref 10–40)
Albumin: 4.8 g/dL (ref 3.6–5.1)
Alkaline Phosphatase: 48 U/L (ref 40–115)
BUN: 20 mg/dL (ref 7–25)
CALCIUM: 9.4 mg/dL (ref 8.6–10.3)
CO2: 28 mmol/L (ref 20–31)
Chloride: 103 mmol/L (ref 98–110)
Creat: 0.97 mg/dL (ref 0.60–1.35)
GLUCOSE: 108 mg/dL — AB (ref 65–99)
POTASSIUM: 4.2 mmol/L (ref 3.5–5.3)
Sodium: 141 mmol/L (ref 135–146)
Total Bilirubin: 0.6 mg/dL (ref 0.2–1.2)
Total Protein: 7.3 g/dL (ref 6.1–8.1)

## 2015-09-07 LAB — CBC
HCT: 43.7 % (ref 39.0–52.0)
Hemoglobin: 15.4 g/dL (ref 13.0–17.0)
MCH: 30.6 pg (ref 26.0–34.0)
MCHC: 35.2 g/dL (ref 30.0–36.0)
MCV: 86.7 fL (ref 78.0–100.0)
MPV: 10 fL (ref 8.6–12.4)
Platelets: 235 10*3/uL (ref 150–400)
RBC: 5.04 MIL/uL (ref 4.22–5.81)
RDW: 13.3 % (ref 11.5–15.5)
WBC: 8.3 10*3/uL (ref 4.0–10.5)

## 2015-09-07 LAB — LIPID PANEL
Cholesterol: 209 mg/dL — ABNORMAL HIGH (ref 125–200)
HDL: 31 mg/dL — AB (ref >=40)
LDL Cholesterol: 122 mg/dL (ref <130)
TRIGLYCERIDES: 282 mg/dL — AB (ref <150)
Total CHOL/HDL Ratio: 6.7 Ratio — ABNORMAL HIGH (ref <=5.0)
VLDL: 56 mg/dL — AB (ref <30)

## 2015-09-07 LAB — POCT URINALYSIS DIP (MANUAL ENTRY)
BILIRUBIN UA: NEGATIVE
BILIRUBIN UA: NEGATIVE
Blood, UA: NEGATIVE
Glucose, UA: NEGATIVE
Leukocytes, UA: NEGATIVE
Nitrite, UA: NEGATIVE
Protein Ur, POC: NEGATIVE
Spec Grav, UA: 1.025
Urobilinogen, UA: 0.2
pH, UA: 6

## 2015-09-07 LAB — TSH: TSH: 1.726 u[IU]/mL (ref 0.350–4.500)

## 2015-09-07 LAB — HIV ANTIBODY (ROUTINE TESTING W REFLEX): HIV: NONREACTIVE

## 2015-09-07 MED ORDER — FENOFIBRATE 160 MG PO TABS: 160.0000 mg | ORAL_TABLET | Freq: Every day | ORAL | Status: DC

## 2015-09-07 MED ORDER — OMEPRAZOLE 20 MG PO CPDR: 20.0000 mg | DELAYED_RELEASE_CAPSULE | Freq: Every day | ORAL | Status: DC

## 2015-09-07 NOTE — Patient Instructions (Signed)

## 2015-09-07 NOTE — Progress Notes (Signed)
Subjective:    Patient ID: Zachary Shelton, male    DOB: 1970/12/13, 45 y.o.   MRN: [id number] Chief Complaint  Patient presents with  . Annual Exam  . form to fill out for work    HPI  [name] is a 45 yo male here today for a complete physical exam. He was last seen for a full physical by my colleague Dr. Alwyn Ren 2 years prior.  GERD: often taker ppi - improving on the omeprazole  HPL: h/o elev trig [phone] making LDL non-calc. Total chol 208-246. Low HDL 31-32 - risk for metabolic syndrome.  Weight:  H/o hematuria at last OV: unsure of distal urethral abrasion vs passed stone as pt had one severe epidose of dysuria immed prior to eval.  Advised pt to increase water, strain urine, and recheck with urology referral if sxs persisted.  Did have an initial PSA 3 yrs ago that was normal at 0.97. No FamHx of prostate can and no prostate sxs.  Immunizations: none in Epic - tdap 3-4 yrs. Flu declined today  Diet/exercise:   Started the fenofibrate a month ago.  Past Medical History  Diagnosis Date  . GERD (gastroesophageal reflux disease)    History reviewed. No pertinent past surgical history. No current outpatient prescriptions on file prior to visit.   No current facility-administered medications on file prior to visit.   No Known Allergies Family History  Problem Relation Age of Onset  . Alzheimer's disease Maternal Grandmother   . Heart disease Maternal Grandfather   . Alzheimer's disease Paternal Grandfather    Grandfather at 10 yo and dec from MI at 60 yo. Mom w/o trouble, he did smokie  Social History   Social History  . Marital Status: Married    Spouse Name: N/A  . Number of Children: N/A  . Years of Education: N/A   Occupational History  . Engineer, petroleum    Social History Main Topics  . Smoking status: Never Smoker   . Smokeless tobacco: None  . Alcohol Use: 1.2 - 1.8 oz/week    2-3 Glasses of wine per week  . Drug Use: No  . Sexual Activity: Yes     Other Topics Concern  . None   Social History Narrative   Married   Exercise:yes     Review of Systems  All other systems reviewed and are negative.      Objective:  Physical Exam  Constitutional: He is oriented to person, place, and time. He appears well-developed and well-nourished. No distress.  HENT:  Head: Normocephalic and atraumatic.  Right Ear: Tympanic membrane, external ear and ear canal normal.  Left Ear: Tympanic membrane, external ear and ear canal normal.  Nose: Nose normal.  Mouth/Throat: Uvula is midline, oropharynx is clear and moist and mucous membranes are normal. No oropharyngeal exudate.  Eyes: Conjunctivae are normal. Right eye exhibits no discharge. Left eye exhibits no discharge. No scleral icterus.  Neck: Normal range of motion. Neck supple. No thyromegaly present.  Cardiovascular: Normal rate, regular rhythm, normal heart sounds and intact distal pulses.   Pulmonary/Chest: Effort normal and breath sounds normal. No respiratory distress.  Abdominal: Soft. Bowel sounds are normal. He exhibits no distension and no mass. There is no tenderness. There is no rebound and no guarding.  Musculoskeletal: He exhibits no edema.  Lymphadenopathy:    He has no cervical adenopathy.  Neurological: He is alert and oriented to person, place, and time. He has normal reflexes. No cranial nerve  deficit. He exhibits normal muscle tone.  Skin: Skin is warm and dry. No rash noted. He is not diaphoretic. No erythema.  Psychiatric: He has a normal mood and affect. His behavior is normal.    BP 130/80 mmHg  Pulse 99  Temp(Src) 99.2 F (37.3 C) (Oral)  Resp 16  Ht 5' 8.5" (1.74 m)  Wt 241 lb 6.4 oz (109.498 kg)  BMI 36.17 kg/m2  SpO2 96%  [facility] reading (PRIMARY) by  Dr. Clelia Croft. EKG: NSR, no acute ischemic change    Assessment & Plan:   1. Annual physical exam   2. Gastroesophageal reflux disease, esophagitis presence not specified   3. Hypertriglyceridemia - trig  have been 600s so not able to calc LDL prior - however, total chol is always ok.  Last yr he was seen at a different urgent care for his CPE and was started on fenofibrate at that time - is tolerating it fine, no side effects,once  has not had labs rechecked since starting. Isn't great about compliance so just restarted it last mo. Didn't tolerate statin prior.  4. Obesity, unspecified   5. Routine screening for STI (sexually transmitted infection)   6. Screening for cardiovascular, respiratory, and genitourinary diseases   7. Screening for deficiency anemia   8. Screening for thyroid disorder     Orders Placed This Encounter  Procedures  . CBC  . Comprehensive metabolic panel    Order Specific Question:  Has the patient fasted?    Answer:  Yes  . TSH  . Lipid panel    Order Specific Question:  Has the patient fasted?    Answer:  Yes  . HIV antibody  . POCT urinalysis dipstick  . POCT Microscopic Urinalysis ([facility])  . EKG 12-Lead    Meds ordered this encounter  Medications  . DISCONTD: fenofibrate 160 MG tablet    Sig: Take 160 mg by mouth daily.  Marland Kitchen omeprazole (PRILOSEC) 20 MG capsule    Sig: Take 1 capsule (20 mg total) by mouth daily.    Dispense:  90 capsule    Refill:  3  . fenofibrate 160 MG tablet    Sig: Take 1 tablet (160 mg total) by mouth daily.    Dispense:  90 tablet    Refill:  3    Norberto Sorenson, MD MPH

## 2015-09-07 NOTE — Progress Notes (Signed)
   Subjective:    Patient ID: Zachary Shelton, male    DOB: 1971/04/05, 45 y.o.   MRN: [id number]  HPI    Review of Systems  Constitutional: Negative.   HENT: Negative.   Eyes: Negative.   Respiratory: Negative.   Cardiovascular: Negative.   Gastrointestinal: Negative.   Endocrine: Negative.   Genitourinary: Negative.   Musculoskeletal: Negative.   Skin: Negative.   Allergic/Immunologic: Negative.   Neurological: Negative.   Hematological: Negative.   Psychiatric/Behavioral: Negative.        Objective:   Physical Exam        Assessment & Plan:

## 2015-09-08 LAB — LDL CHOLESTEROL, DIRECT: LDL DIRECT: 127 mg/dL (ref <130)

## 2015-09-11 ENCOUNTER — Encounter: Payer: Self-pay | Admitting: Family Medicine

## 2016-01-05 ENCOUNTER — Ambulatory Visit (INDEPENDENT_AMBULATORY_CARE_PROVIDER_SITE_OTHER): Payer: BLUE CROSS/BLUE SHIELD | Admitting: Physician Assistant

## 2016-01-05 ENCOUNTER — Other Ambulatory Visit: Payer: Self-pay | Admitting: Physician Assistant

## 2016-01-05 VITALS — BP 122/72 | HR 85 | Temp 98.2°F | Resp 17 | Ht 68.5 in | Wt 237.0 lb

## 2016-01-05 DIAGNOSIS — G4482 Headache associated with sexual activity: Secondary | ICD-10-CM

## 2016-01-05 NOTE — Progress Notes (Signed)
   Zachary Shelton  MRN: [id number] DOB: [birth year]  Subjective:  Pt presents to clinic with a headache for the last week every time he has intercourse - the headache starts during intercourse and not just with orgasm - he has had to stop during intercourse because of the headache and he states that is not like him.  He has had intercourse 3 times this week and every time he has gotten a throbbing all over headache but he does not feel like the pain has gotten worse each time - 2 of the times he headache has gone away within hours but the last time the headache was residual the next morning.  He is having no other symptoms like weakness, vision changes.  Pt is not having headaches with any other activities.  He does not have other types of headaches.  Patient Active Problem List   Diagnosis Date Noted  . Hypertriglyceridemia 09/23/2013  . Obesity, unspecified 09/23/2013  . GERD (gastroesophageal reflux disease) 09/03/2012    Current Outpatient Prescriptions on File Prior to Visit  Medication Sig Dispense Refill  . omeprazole (PRILOSEC) 20 MG capsule Take 1 capsule (20 mg total) by mouth daily. 90 capsule 3   No current facility-administered medications on file prior to visit.    No Known Allergies  Review of Systems  Constitutional: Negative for fever and chills.  Eyes: Negative for visual disturbance.  Neurological: Positive for headaches. Negative for weakness.   Objective:  BP 122/72 mmHg  Pulse 85  Temp(Src) 98.2 F (36.8 C) (Oral)  Resp 17  Ht 5' 8.5" (1.74 m)  Wt 237 lb (107.502 kg)  BMI 35.51 kg/m2  SpO2 97%  Physical Exam  Constitutional: He is oriented to person, place, and time and well-developed, well-nourished, and in no distress.  HENT:  Head: Normocephalic and atraumatic.  Right Ear: External ear normal.  Left Ear: External ear normal.  Eyes: Conjunctivae, EOM and lids are normal.  Neck: Normal range of motion.  Cardiovascular: Normal rate, regular rhythm  and normal heart sounds.   No murmur heard. Pulmonary/Chest: Effort normal and breath sounds normal. He has no wheezes.  Neurological: He is alert and oriented to person, place, and time. He has normal sensation, normal strength, normal reflexes and intact cranial nerves. He displays no weakness, facial symmetry and normal reflexes. No cranial nerve deficit. He has a normal Cerebellar Exam and a normal Romberg Test. He shows no pronator drift. Gait normal. Coordination and gait normal.  Skin: Skin is warm and dry.  Psychiatric: Mood, memory, affect and judgment normal.    Assessment and Plan :  Headache associated with sexual activity - Plan: MR MRA Head/Brain Wo Cm - pt is having sexual headaches but not orgasmic headache - he will refrain from sexual activity until after the MRI/MRA - if it is normal we can try some indomethacin prior to intercourse - we discussed other options as this may resolve as quickly as it started.  His questions were answered and he understands and agrees with the plan.  Zachary LennertSarah Cathyrn Deas PA-C  [facility] 01/05/2016 6:47 PM

## 2016-01-05 NOTE — Patient Instructions (Addendum)
  MRI is scheduled for 6/6 at 4pm [facility] - you will go to admissions and check-in at 3:30pm  Please do not engage in sexual activity until after you have heard from me about the results.   IF you received an x-ray today, you will receive an invoice from [facility]. Please contact [facility] at [phone] with questions or concerns regarding your invoice.   IF you received labwork today, you will receive an invoice from [facility]. Please contact [facility] at [phone] with questions or concerns regarding your invoice.   Our billing staff will not be able to assist you with questions regarding bills from these companies.  You will be contacted with the lab results as soon as they are available. The fastest way to get your results is to activate your My Chart account. Instructions are located on the last page of this paperwork. If you have not heard from us regarding the results in 2 weeks, please contact this office.

## 2016-01-09 ENCOUNTER — Ambulatory Visit (HOSPITAL_COMMUNITY)
Admission: RE | Admit: 2016-01-09 | Discharge: 2016-01-09 | Disposition: A | Discharge status: Home / Self Care | Payer: BLUE CROSS/BLUE SHIELD | Source: Ambulatory Visit | Attending: Physician Assistant | Admitting: Physician Assistant

## 2016-01-09 ENCOUNTER — Telehealth: Payer: Self-pay | Admitting: Physician Assistant

## 2016-01-09 ENCOUNTER — Ambulatory Visit (HOSPITAL_COMMUNITY): Admission: RE | Admit: 2016-01-09 | Payer: BLUE CROSS/BLUE SHIELD | Source: Ambulatory Visit

## 2016-01-09 DIAGNOSIS — G4482 Headache associated with sexual activity: Secondary | ICD-10-CM | POA: Insufficient documentation

## 2016-01-09 NOTE — Telephone Encounter (Signed)
LMOM that MRI was normal and that he can resume activity and to contact me if the headaches occur and we can start him on Indomethacin prior to sexual activity.

## 2016-01-10 ENCOUNTER — Telehealth: Payer: Self-pay

## 2016-01-10 NOTE — Telephone Encounter (Signed)
Pt is returning call from sarah and states he does need the rx for indomethacin   Best number [phone]

## 2016-01-11 MED ORDER — INDOMETHACIN 25 MG PO CAPS
25.0000 mg | ORAL_CAPSULE | Freq: Every day | ORAL | Status: DC | PRN
Start: 2016-01-11 — End: 2016-02-07

## 2016-01-11 NOTE — Telephone Encounter (Signed)
I have sent the Rx to the pharmacy - he will start with a pill 30-60 mins prior to sex and he can increase to 2 pills if he needs to.  The other thing that he might try is to have more frequent less strenuous sex and that may help the headaches resolve.

## 2016-01-11 NOTE — Telephone Encounter (Signed)
Called unable to leave message

## 2016-01-12 NOTE — Telephone Encounter (Signed)
Pt advised.

## 2016-02-07 ENCOUNTER — Other Ambulatory Visit: Payer: Self-pay | Admitting: Physician Assistant

## 2016-08-21 NOTE — Progress Notes (Deleted)
   Subjective:    Patient ID: Starleen BlueBradley Hufstetler, male    DOB: [birth year], 46 y.o.   MRN: [id number]  HPI [name] is a 46 yo male here today for a complete physical exam.  I last saw him for the same 1 year prior.  GERD: often taker ppi - improving on the omeprazole  HPL: h/o elev trig [phone] making LDL non-calc. Total chol 208-246. Low HDL 31-32 - risk for metabolic syndrome.  Weight:  H/o hematuria at last OV: unsure of distal urethral abrasion vs passed stone as pt had one severe epidose of dysuria immed prior to eval.  Advised pt to increase water, strain urine, and recheck with urology referral if sxs persisted.  Did have an initial PSA 3 yrs ago that was normal at 0.97. No FamHx of prostate can and no prostate sxs.  Immunizations: none in Epic - tdap 3-4 yrs. Flu declined today  Diet/exercise:   Started the fenofibrate a month ago.    Review of Systems     Objective:   Physical Exam        Assessment & Plan:

## 2016-08-22 ENCOUNTER — Encounter: Payer: BLUE CROSS/BLUE SHIELD | Admitting: Family Medicine

## 2016-08-29 ENCOUNTER — Ambulatory Visit (INDEPENDENT_AMBULATORY_CARE_PROVIDER_SITE_OTHER): Payer: BLUE CROSS/BLUE SHIELD | Admitting: Family Medicine

## 2016-08-29 ENCOUNTER — Encounter: Payer: Self-pay | Admitting: Family Medicine

## 2016-08-29 VITALS — BP 109/68 | HR 73 | Temp 98.6°F | Ht 68.5 in | Wt 219.8 lb

## 2016-08-29 DIAGNOSIS — Z6832 Body mass index (BMI) 32.0-32.9, adult: Secondary | ICD-10-CM | POA: Diagnosis not present

## 2016-08-29 DIAGNOSIS — Z1389 Encounter for screening for other disorder: Secondary | ICD-10-CM | POA: Diagnosis not present

## 2016-08-29 DIAGNOSIS — Z130 Encounter for screening for diseases of the blood and blood-forming organs and certain disorders involving the immune mechanism: Secondary | ICD-10-CM | POA: Diagnosis not present

## 2016-08-29 DIAGNOSIS — E781 Pure hyperglyceridemia: Secondary | ICD-10-CM

## 2016-08-29 DIAGNOSIS — Z1329 Encounter for screening for other suspected endocrine disorder: Secondary | ICD-10-CM | POA: Diagnosis not present

## 2016-08-29 DIAGNOSIS — Z87448 Personal history of other diseases of urinary system: Secondary | ICD-10-CM | POA: Diagnosis not present

## 2016-08-29 DIAGNOSIS — K219 Gastro-esophageal reflux disease without esophagitis: Secondary | ICD-10-CM

## 2016-08-29 DIAGNOSIS — Z0000 Encounter for general adult medical examination without abnormal findings: Secondary | ICD-10-CM | POA: Diagnosis not present

## 2016-08-29 DIAGNOSIS — Z1383 Encounter for screening for respiratory disorder NEC: Secondary | ICD-10-CM | POA: Diagnosis not present

## 2016-08-29 DIAGNOSIS — E6609 Other obesity due to excess calories: Secondary | ICD-10-CM

## 2016-08-29 DIAGNOSIS — Z113 Encounter for screening for infections with a predominantly sexual mode of transmission: Secondary | ICD-10-CM | POA: Diagnosis not present

## 2016-08-29 DIAGNOSIS — Z136 Encounter for screening for cardiovascular disorders: Secondary | ICD-10-CM

## 2016-08-29 LAB — POCT URINALYSIS DIP (MANUAL ENTRY)
BILIRUBIN UA: NEGATIVE
GLUCOSE UA: NEGATIVE
Ketones, POC UA: NEGATIVE
Leukocytes, UA: NEGATIVE
Nitrite, UA: NEGATIVE
Protein Ur, POC: NEGATIVE
RBC UA: NEGATIVE
SPEC GRAV UA: 1.02
Urobilinogen, UA: 0.2
pH, UA: 6

## 2016-08-29 NOTE — Progress Notes (Signed)
Subjective:    Patient ID: Zachary BlueBradley Shelton, male    DOB: 06-19-1971, 46 y.o.   MRN: [id number] Chief Complaint  Patient presents with  . Annual Exam    CPE    HPI  Mr. Zachary Shelton is a 46 yo male here today for his complete physical.  Primary Preventative Screenings: Prostate Cancer: baseline psa low 0.97 4 yrs prior STI screening: Colorectal Cancer: H/o tobacco use: none Cardiac: normal baseline EKG last yr Weight/diet/exercise: For the past 3 mos has started a low carb/high protein diet and has been having excellent success with weight loss.  Does eat a lot of eggs and bacon, working out some - mainly walking and running.  No complex carbs like pasta or bread but still eats fruits and veggies. OTC/vit/supp/herbal: Dentist/Optho: Immunizations: reports tdap was done around 2014  In June he suddenly developed recurrent severe pressure headaches with sexual activity (not purely orgasmic headache).  Felt like his head was going to explode. Exam was normal, no other sxs, MRI/MRA of brain was normal so pt was prescribed indomethacin 30 to 60 min prior to sexual activity. He did this for sev wks and then was able to stop and the HAs have not recurred.   GERD: decreased omeprazole  HPL: h/o elev trig [phone] making LDL non-calc. Total chol 208-246. Low HDL 31-32 - risk for metabolic syndrome. Started on fenofibrate last year by other physician - he was not great with med compliance so at last lipid panel had only been taking it about 1 mo but trig had already decreased to 282 so LDL was 122. Pt was tolerating fenofibrate well but did not want to be on a med if not necessary so stopped it after a few months.  He did have a treadmill EKG sev yrs prior which was normal.   PGM with high chol at 46 yo and still going strong!  Past Medical History:  Diagnosis Date  . GERD (gastroesophageal reflux disease)    History reviewed. No pertinent surgical history. Current Outpatient Prescriptions on  File Prior to Visit  Medication Sig Dispense Refill  . omeprazole (PRILOSEC) 20 MG capsule Take 1 capsule (20 mg total) by mouth daily. 90 capsule 3   No current facility-administered medications on file prior to visit.    No Known Allergies Family History  Problem Relation Age of Onset  . Alzheimer's disease Maternal Grandmother   . Heart disease Maternal Grandfather   . Alzheimer's disease Paternal Grandfather    Social History   Social History  . Marital status: Married    Spouse name: N/A  . Number of Shelton: N/A  . Years of education: N/A   Occupational History  . Engineer, [employer]ering [employer]    Social History Main Topics  . Smoking status: Never Smoker  . Smokeless tobacco: Never Used  . Alcohol use 1.2 - 1.8 oz/week    2 - 3 Glasses of wine per week  . Drug use: No  . Sexual activity: Yes   Other Topics Concern  . None   Social History Narrative   Married   Exercise:yes   Depression screen [facility] 2/9 08/29/2016 01/05/2016 09/07/2015 03/06/2015  Decreased Interest 0 0 0 0  Down, Depressed, Hopeless 0 0 0 0  PHQ - 2 Score 0 0 0 0     Review of Systems  All other systems reviewed and are negative.      Objective:   Physical Exam  Constitutional: He is oriented to person, place, and  time. He appears well-developed and well-nourished. No distress.  HENT:  Head: Normocephalic and atraumatic.  Right Ear: Tympanic membrane, external ear and ear canal normal.  Left Ear: Tympanic membrane, external ear and ear canal normal.  Nose: Nose normal.  Mouth/Throat: Uvula is midline, oropharynx is clear and moist and mucous membranes are normal. No oropharyngeal exudate.  Eyes: Conjunctivae are normal. Right eye exhibits no discharge. Left eye exhibits no discharge. No scleral icterus.  Neck: Normal range of motion. Neck supple. No thyromegaly present.  Cardiovascular: Normal rate, regular rhythm, normal heart sounds and intact distal pulses.   Pulmonary/Chest: Effort normal and  breath sounds normal. No respiratory distress.  Abdominal: Soft. Bowel sounds are normal. He exhibits no distension and no mass. There is no tenderness. There is no rebound and no guarding.  Musculoskeletal: He exhibits no edema.  Lymphadenopathy:    He has no cervical adenopathy.  Neurological: He is alert and oriented to person, place, and time. He has normal reflexes. No cranial nerve deficit. He exhibits normal muscle tone.  Skin: Skin is warm and dry. No rash noted. He is not diaphoretic. No erythema.  Psychiatric: He has a normal mood and affect. His behavior is normal.    BP 109/68 (BP Location: Right Arm, Patient Position: Sitting, Cuff Size: Small)   Pulse 73   Temp 98.6 F (37 C) (Oral)   Ht 5' 8.5" (1.74 m)   Wt 219 lb 12.8 oz (99.7 kg)   SpO2 97%   BMI 32.93 kg/m      Assessment & Plan:   1. Annual physical exam   2. Routine screening for STI (sexually transmitted infection)   3. Screening for cardiovascular, respiratory, and genitourinary diseases   4. Screening for deficiency anemia   5. Screening for thyroid disorder   6. Gastroesophageal reflux disease, esophagitis presence not specified   7. Class 1 obesity due to excess calories without serious comorbidity with body mass index (BMI) of 32.0 to 32.9 in adult - doing amazing with diet/exercise   8. Hypertriglyceridemia - responded very well to fenofibrate but not sure it will make a difference in outcomes for him. He practices an excellent lifestyle. Suspect his PGM has a similar lipid profile and she is still in great health at 47 yo. Willing to restart a statin if it is needed.  Very curious to see lipids this yr since he has been doing such a high fat diet.   9. History of changes in urinary output - recheck psa    Orders Placed This Encounter  Procedures  . CBC  . Comprehensive metabolic panel    Order Specific Question:   Has the patient fasted?    Answer:   Yes  . TSH  . Lipid panel    Order Specific  Question:   Has the patient fasted?    Answer:   Yes  . High sensitivity CRP  . PSA  . POCT urinalysis dipstick     Zachary Shelton, M.D.  [facility] [address] [place], [place] [address] [phone] phone [phone] fax  08/31/16 11:57 PM

## 2016-08-29 NOTE — Patient Instructions (Addendum)
   IF you received an x-ray today, you will receive an invoice from [facility]. Please contact [facility] at [phone] with questions or concerns regarding your invoice.   IF you received labwork today, you will receive an invoice from [facility]. Please contact [facility] at [phone] with questions or concerns regarding your invoice.   Our billing staff will not be able to assist you with questions regarding bills from these companies.  You will be contacted with the lab results as soon as they are available. The fastest way to get your results is to activate your My Chart account. Instructions are located on the last page of this paperwork. If you have not heard from us regarding the results in 2 weeks, please contact this office.    Food Choices to Lower Your Triglycerides Triglycerides are a type of fat in your blood. High levels of triglycerides can increase the risk of heart disease and stroke. If your triglyceride levels are high, the foods you eat and your eating habits are very important. Choosing the right foods can help lower your triglycerides. What general guidelines do I need to follow?  Lose weight if you are overweight.  Limit or avoid alcohol.  Fill one half of your plate with vegetables and green salads.  Limit fruit to two servings a day. Choose fruit instead of juice.  Make one fourth of your plate whole grains. Look for the word "whole" as the first word in the ingredient list.  Fill one fourth of your plate with lean protein foods.  Enjoy fatty fish (such as salmon, mackerel, sardines, and tuna) three times a week.  Choose healthy fats.  Limit foods high in starch and sugar.  Eat more home-cooked food and less restaurant, buffet, and fast food.  Limit fried foods.  Cook foods using methods other than frying.  Limit saturated fats.  Check ingredient lists to avoid foods with partially hydrogenated oils (trans fats) in them. What  foods can I eat? Grains Whole grains, such as whole wheat or whole grain breads, crackers, cereals, and pasta. Unsweetened oatmeal, bulgur, barley, quinoa, or brown rice. Corn or whole wheat flour tortillas. Vegetables Fresh or frozen vegetables (raw, steamed, roasted, or grilled). Green salads. Fruits All fresh, canned (in natural juice), or frozen fruits. Meat and Other Protein Products Ground beef (85% or leaner), grass-fed beef, or beef trimmed of fat. Skinless chicken or turkey. Ground chicken or turkey. Pork trimmed of fat. All fish and seafood. Eggs. Dried beans, peas, or lentils. Unsalted nuts or seeds. Unsalted canned or dry beans. Dairy Low-fat dairy products, such as skim or 1% milk, 2% or reduced-fat cheeses, low-fat ricotta or cottage cheese, or plain low-fat yogurt. Fats and Oils Tub margarines without trans fats. Light or reduced-fat mayonnaise and salad dressings. Avocado. Safflower, olive, or canola oils. Natural peanut or almond butter. The items listed above may not be a complete list of recommended foods or beverages. Contact your dietitian for more options. What foods are not recommended? Grains White bread. White pasta. White rice. Cornbread. Bagels, pastries, and croissants. Crackers that contain trans fat. Vegetables White potatoes. Corn. Creamed or fried vegetables. Vegetables in a cheese sauce. Fruits Dried fruits. Canned fruit in light or heavy syrup. Fruit juice. Meat and Other Protein Products Fatty cuts of meat. Ribs, chicken wings, bacon, sausage, bologna, salami, chitterlings, fatback, hot dogs, bratwurst, and packaged luncheon meats. Dairy Whole or 2% milk, cream, half-and-half, and cream cheese. Whole-fat or sweetened yogurt. Full-fat cheeses. Nondairy   creamers and whipped toppings. Processed cheese, cheese spreads, or cheese curds. Sweets and Desserts Corn syrup, sugars, honey, and molasses. Candy. Jam and jelly. Syrup. Sweetened cereals. Cookies, pies,  cakes, donuts, muffins, and ice cream. Fats and Oils Butter, stick margarine, lard, shortening, ghee, or bacon fat. Coconut, palm kernel, or palm oils. Beverages Alcohol. Sweetened drinks (such as sodas, lemonade, and fruit drinks or punches). The items listed above may not be a complete list of foods and beverages to avoid. Contact your dietitian for more information. This information is not intended to replace advice given to you by your health care provider. Make sure you discuss any questions you have with your health care provider. Document Released: 05/09/2004 Document Revised: 12/28/2015 Document Reviewed: 05/26/2013 Elsevier Interactive Patient Education  2017 Elsevier Inc.  

## 2016-08-30 LAB — LIPID PANEL
CHOL/HDL RATIO: 6.4 ratio — AB (ref 0.0–5.0)
CHOLESTEROL TOTAL: 235 mg/dL — AB (ref 100–199)
HDL: 37 mg/dL — ABNORMAL LOW (ref >39)
LDL Calculated: 154 mg/dL — ABNORMAL HIGH (ref 0–99)
TRIGLYCERIDES: 219 mg/dL — AB (ref 0–149)
VLDL Cholesterol Cal: 44 mg/dL — ABNORMAL HIGH (ref 5–40)

## 2016-08-30 LAB — CBC
Hematocrit: 43.8 % (ref 37.5–51.0)
Hemoglobin: 14.6 g/dL (ref 13.0–17.7)
MCH: 29.6 pg (ref 26.6–33.0)
MCHC: 33.3 g/dL (ref 31.5–35.7)
MCV: 89 fL (ref 79–97)
PLATELETS: 203 10*3/uL (ref 150–379)
RBC: 4.94 x10E6/uL (ref 4.14–5.80)
RDW: 13.4 % (ref 12.3–15.4)
WBC: 6.2 10*3/uL (ref 3.4–10.8)

## 2016-08-30 LAB — TSH: TSH: 2.13 u[IU]/mL (ref 0.450–4.500)

## 2016-08-30 LAB — COMPREHENSIVE METABOLIC PANEL
A/G RATIO: 2.3 — AB (ref 1.2–2.2)
ALK PHOS: 51 IU/L (ref 39–117)
ALT: 25 IU/L (ref 0–44)
AST: 18 IU/L (ref 0–40)
Albumin: 4.9 g/dL (ref 3.5–5.5)
BILIRUBIN TOTAL: 0.4 mg/dL (ref 0.0–1.2)
BUN/Creatinine Ratio: 22 — ABNORMAL HIGH (ref 9–20)
BUN: 19 mg/dL (ref 6–24)
CHLORIDE: 102 mmol/L (ref 96–106)
CO2: 25 mmol/L (ref 18–29)
Calcium: 9.5 mg/dL (ref 8.7–10.2)
Creatinine, Ser: 0.86 mg/dL (ref 0.76–1.27)
GFR calc Af Amer: 120 mL/min/{1.73_m2} (ref >59)
GFR calc non Af Amer: 104 mL/min/{1.73_m2} (ref >59)
Globulin, Total: 2.1 g/dL (ref 1.5–4.5)
Glucose: 102 mg/dL — ABNORMAL HIGH (ref 65–99)
POTASSIUM: 4.6 mmol/L (ref 3.5–5.2)
Sodium: 143 mmol/L (ref 134–144)
Total Protein: 7 g/dL (ref 6.0–8.5)

## 2016-08-30 LAB — HIGH SENSITIVITY CRP: CRP, High Sensitivity: 1.79 mg/L (ref 0.00–3.00)

## 2016-08-30 LAB — PSA: Prostate Specific Ag, Serum: 0.7 ng/mL (ref 0.0–4.0)

## 2016-10-13 ENCOUNTER — Other Ambulatory Visit: Payer: Self-pay | Admitting: Family Medicine

## 2016-10-13 DIAGNOSIS — K219 Gastro-esophageal reflux disease without esophagitis: Secondary | ICD-10-CM

## 2017-01-08 ENCOUNTER — Other Ambulatory Visit: Payer: Self-pay | Admitting: Family Medicine

## 2017-01-08 DIAGNOSIS — K219 Gastro-esophageal reflux disease without esophagitis: Secondary | ICD-10-CM

## 2017-01-23 ENCOUNTER — Other Ambulatory Visit: Payer: Self-pay | Admitting: Family Medicine

## 2017-01-23 DIAGNOSIS — K219 Gastro-esophageal reflux disease without esophagitis: Secondary | ICD-10-CM

## 2017-02-26 IMAGING — MR MR HEAD W/O CM
9 of 11 series · 31 of 48 positions shown · non-contrast
Comparison: None.

CLINICAL DATA: 45-year-old male with sudden onset headaches
affecting the lateral sides of the head during sexual activity for 3
weeks. No known injury. Initial encounter.

EXAM:
MRI HEAD WITHOUT CONTRAST
MRA HEAD WITHOUT CONTRAST
TECHNIQUE: Multiplanar, multiecho pulse sequences of the brain and surrounding
structures were obtained without intravenous contrast. Angiographic
images of the head were obtained using MRA technique without
contrast.

[Series 2: FLAIR · sagittal · 5.0mm · 0.47mm/px · 1 of 23 slices shown (1 of 2)]
[im 1/23]
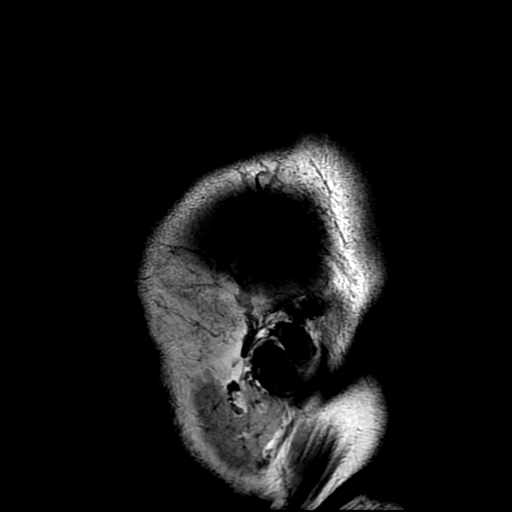

[Series 4: DWI · axial · 3.0mm · 0.94mm/px · z∈[-103,+43]mm · 7 of 100 slices shown (1 of 2)]
[im 1/100]
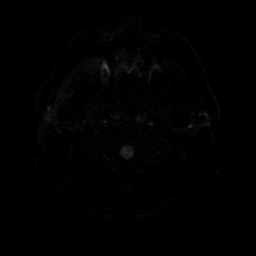
[im 17/100]
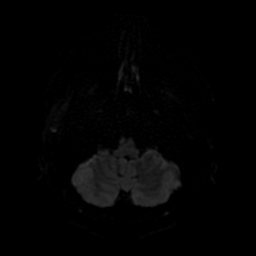
[im 34/100]
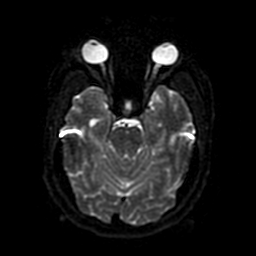
[im 50/100]
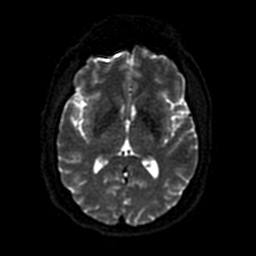
[im 67/100]
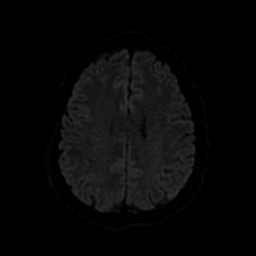
[im 83/100]
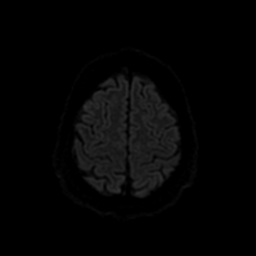
[im 100/100]
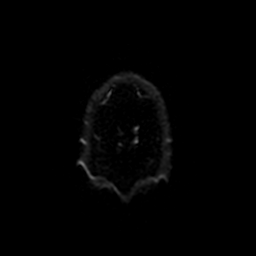

[Series 6: ax (id) 2 · axial · 1.0mm · 0.43mm/px · z∈[-92,-32]mm · 6 of 182 slices shown]
[im 1/182]
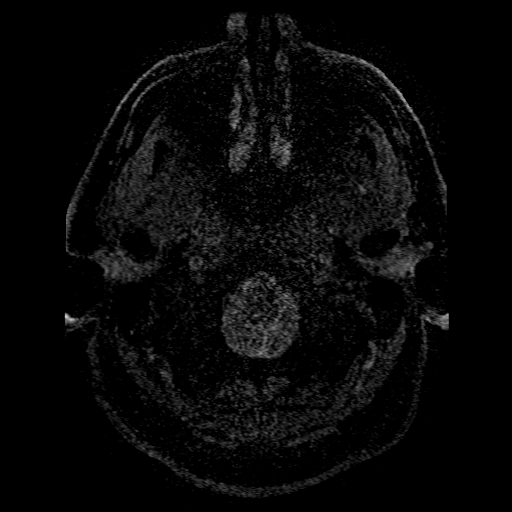
[im 31/182]
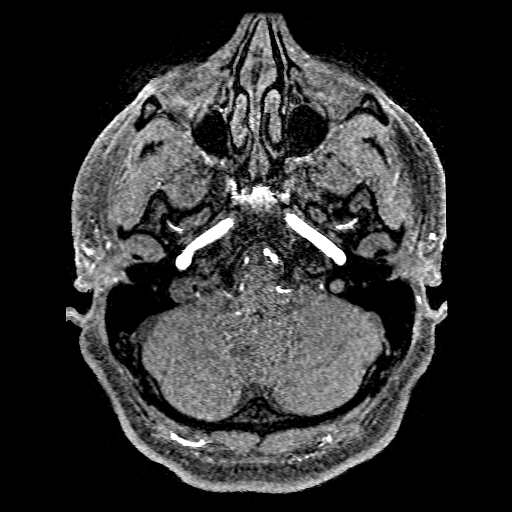
[im 61/182]
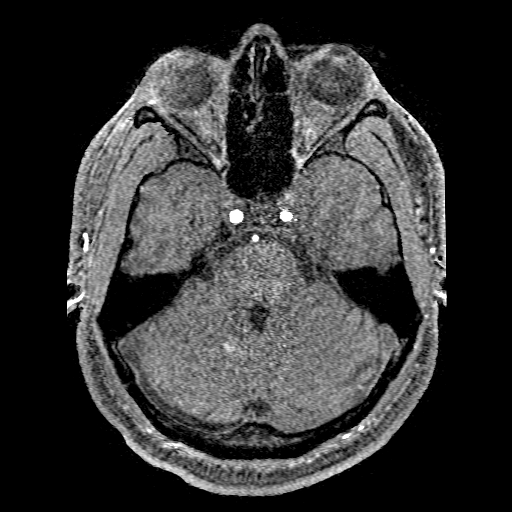
[im 76/182]
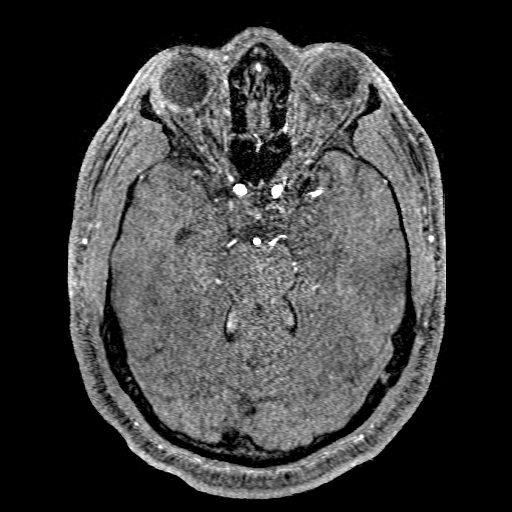
[im 106/182]
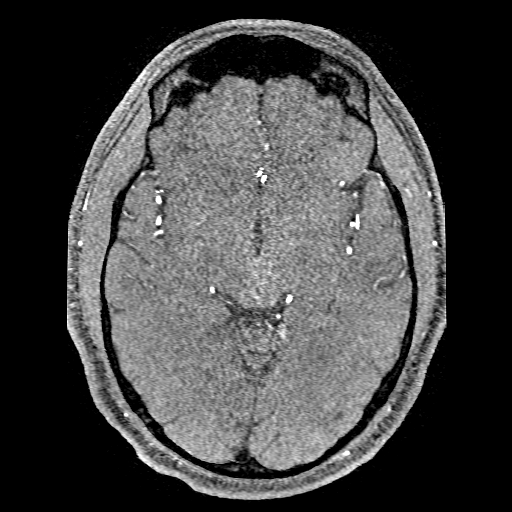
[im 121/182]
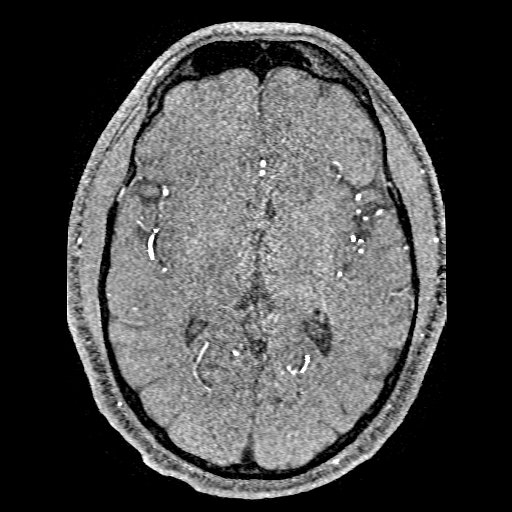

[Series 7: T2 · axial · 5.0mm · 0.47mm/px · z∈[-115,+28]mm · 2 of 25 slices shown (1 of 2)]
[im 1/25]
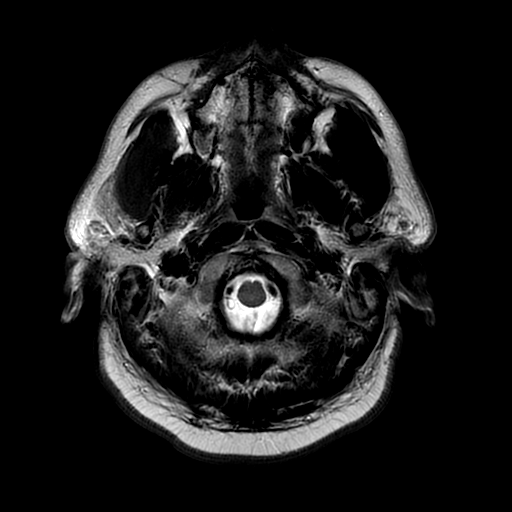
[im 25/25]
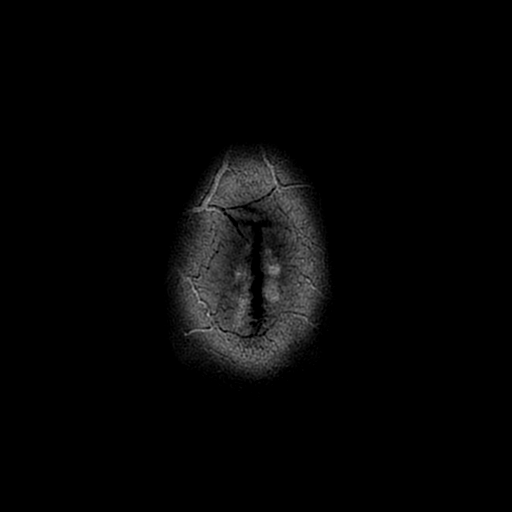

[Series 8: FLAIR · axial · 5.0mm · 0.47mm/px · z∈[-115,+28]mm · 2 of 25 slices shown (2 of 2)]
[im 1/25]
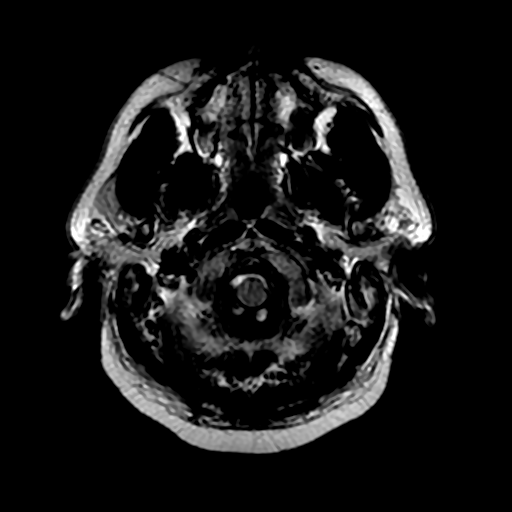
[im 25/25]
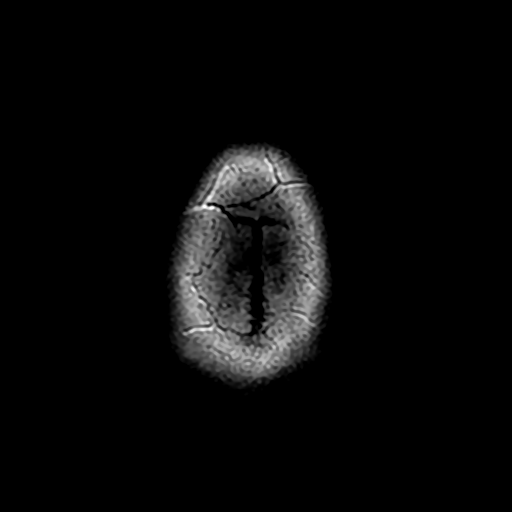

[Series 9: DWI · coronal · 4.0mm · 0.94mm/px · 5 of 72 slices shown (2 of 2)]
[im 1/72]
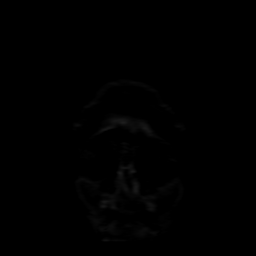
[im 18/72]
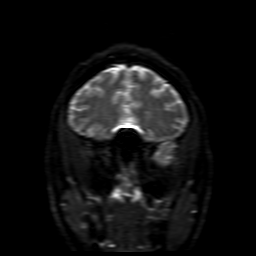
[im 36/72]
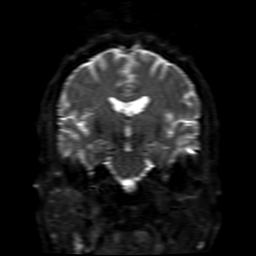
[im 54/72]
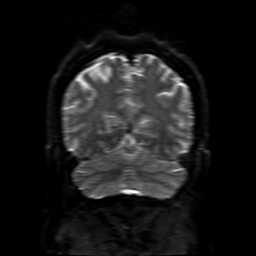
[im 72/72]
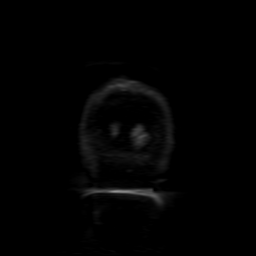

[Series 12: T2 · coronal · 5.0mm · 0.39mm/px · 2 of 30 slices shown (2 of 2)]
[im 1/30]
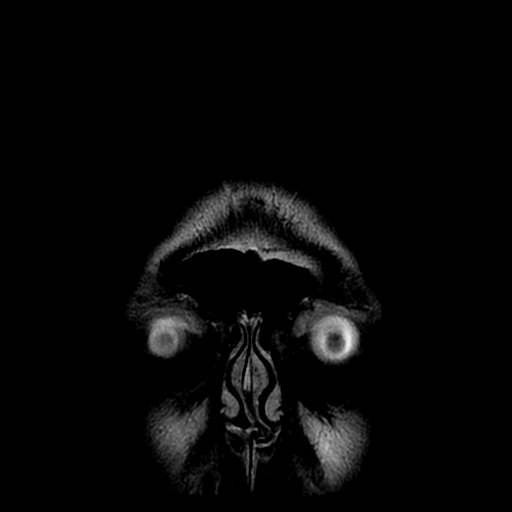
[im 30/30]
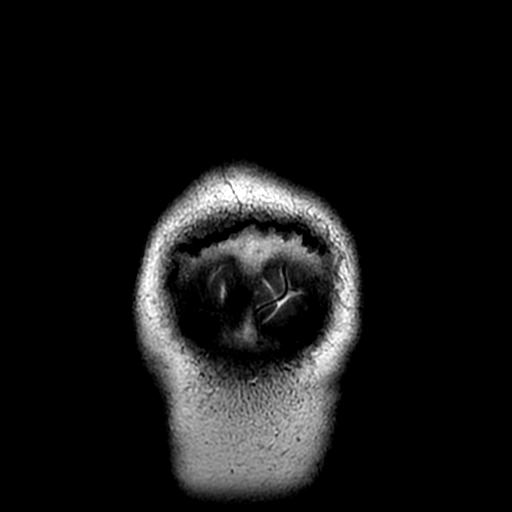

[Series 450: ADC · axial · 3.0mm · 0.94mm/px · z∈[-103,+43]mm · 3 of 50 slices shown (1 of 2)]
[im 1/50]
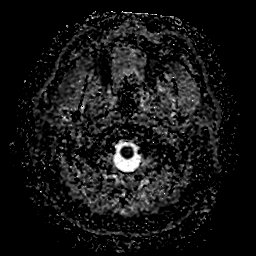
[im 25/50]
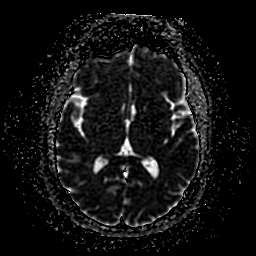
[im 50/50]
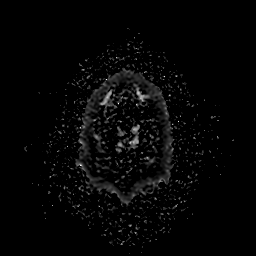

[Series 1350: ADC · coronal · 4.0mm · 0.94mm/px · 3 of 36 slices shown (2 of 2)]
[im 1/36]
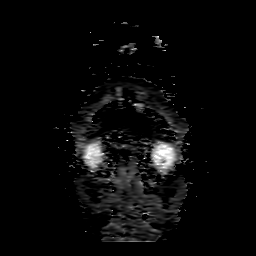
[im 18/36]
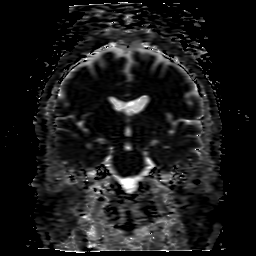
[im 36/36]
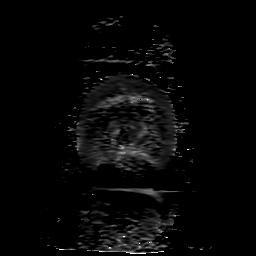

[31 of 48 positions shown; findings below may reference images not displayed]

FINDINGS: MRI HEAD FINDINGS

Cerebral volume is normal. No restricted diffusion to suggest acute
infarction. No midline shift, mass effect, evidence of mass lesion,
ventriculomegaly, extra-axial collection or acute intracranial
hemorrhage. Cervicomedullary junction and pituitary are within
normal limits. Major intracranial vascular flow voids appear within
normal limits. Gray and white matter signal is within normal limits
for age throughout the brain. No chronic cerebral blood products
identified. No cortical encephalomalacia.

Visible internal auditory structures appear normal. Mastoids and
paranasal sinuses are well pneumatized. Negative orbit and scalp
soft tissues. Negative visualized cervical spine. Normal bone marrow
signal.

MRA HEAD FINDINGS

Antegrade flow in the posterior circulation with fairly codominant
appearing distal vertebral arteries. Normal vertebrobasilar
junction. Normal AICA origins. Normal basilar artery. Normal SCA and
left PCA origins. Fetal type right PCA origin. Left posterior
communicating artery is diminutive or absent. Bilateral PCA branches
appear within normal limits; suggestion of left P2 segment
irregularity is felt to be artifact an the vessel has a more normal
appearance on the source images.

Antegrade flow in both ICA siphons. No siphon stenosis. Normal
ophthalmic and right posterior communicating artery origins. Normal
carotid termini. Normal MCA and ACA origins. Anterior communicating
artery is normal. Visualized ACA branches are within normal limits.
MCA M1 segments, bifurcations, and visualized MCA branches appear
normal.

No intracranial aneurysm is identified.
IMPRESSION: 1. Normal noncontrast MRI appearance of the brain.
2. Intracranial MRA is within normal limits.
3. No explanation identified for the new onset headaches peer

## 2017-09-11 ENCOUNTER — Ambulatory Visit (INDEPENDENT_AMBULATORY_CARE_PROVIDER_SITE_OTHER): Payer: BLUE CROSS/BLUE SHIELD | Admitting: Family Medicine

## 2017-09-11 ENCOUNTER — Encounter: Payer: Self-pay | Admitting: Family Medicine

## 2017-09-11 ENCOUNTER — Other Ambulatory Visit: Payer: Self-pay

## 2017-09-11 VITALS — BP 120/88 | HR 71 | Temp 98.2°F | Resp 18 | Ht 68.5 in | Wt 234.2 lb

## 2017-09-11 DIAGNOSIS — Z130 Encounter for screening for diseases of the blood and blood-forming organs and certain disorders involving the immune mechanism: Secondary | ICD-10-CM | POA: Diagnosis not present

## 2017-09-11 DIAGNOSIS — L918 Other hypertrophic disorders of the skin: Secondary | ICD-10-CM

## 2017-09-11 DIAGNOSIS — Z113 Encounter for screening for infections with a predominantly sexual mode of transmission: Secondary | ICD-10-CM | POA: Diagnosis not present

## 2017-09-11 DIAGNOSIS — Z136 Encounter for screening for cardiovascular disorders: Secondary | ICD-10-CM | POA: Diagnosis not present

## 2017-09-11 DIAGNOSIS — Z1383 Encounter for screening for respiratory disorder NEC: Secondary | ICD-10-CM

## 2017-09-11 DIAGNOSIS — Z1389 Encounter for screening for other disorder: Secondary | ICD-10-CM

## 2017-09-11 DIAGNOSIS — E781 Pure hyperglyceridemia: Secondary | ICD-10-CM | POA: Diagnosis not present

## 2017-09-11 DIAGNOSIS — Z0000 Encounter for general adult medical examination without abnormal findings: Secondary | ICD-10-CM

## 2017-09-11 LAB — POCT URINALYSIS DIP (MANUAL ENTRY)
BILIRUBIN UA: NEGATIVE
Blood, UA: NEGATIVE
Glucose, UA: NEGATIVE mg/dL
Ketones, POC UA: NEGATIVE mg/dL
LEUKOCYTES UA: NEGATIVE
Nitrite, UA: NEGATIVE
Protein Ur, POC: NEGATIVE mg/dL
SPEC GRAV UA: 1.015 (ref 1.010–1.025)
Urobilinogen, UA: 0.2 E.U./dL
pH, UA: 7 (ref 5.0–8.0)

## 2017-09-11 NOTE — Progress Notes (Addendum)
Subjective:  By signing my name below, I, Essence Howell, attest that this documentation has been prepared under the direction and in the presence of Norberto Sorenson, MD Electronically Signed: Charline Bills, ED Scribe 09/11/2017 at 8:13 AM.   Patient ID: Zachary Shelton, male    DOB: 1970/10/11, 47 y.o.   MRN: [id number]  Chief Complaint  Patient presents with  . Annual Exam  . Medication Refill    Prilosex 20 MG   HPI Zachary Shelton is a 47 y.o. male who presents to Primary Care at [facility] for an annual exam. Pt is fasting at this visit.  Primary Preventative Screenings: Prostate Cancer: PSA last yr 0.7 STI screening: neg HIV 2017 Tobacco use/AAA/Lung/EtOH/Illicit substances:  Cardiac: EKG 09/2015, HS CRP last yr at average risk Weight/Blood sugar/Diet/Exercise: pt has stopped the low carb diet. He currently does 1 hour of cardio 3 days/wk and strength training 2 days/wk without cp BMI Readings from Last 3 Encounters:  09/11/17 35.09 kg/m  08/29/16 32.93 kg/m  01/09/16 35.51 kg/m   Lab Results  Component Value Date   HGBA1C 5.2 09/23/2013   OTC/Vit/Supp/Herbal: no OTC or supplements Dentist/Optho: saw his dentist yesterday but sees them 3 times/yr, optho regularly Immunizations:  Immunization History  Administered Date(s) Administered  . Tdap 08/05/2012  Influenza: declines  Past Medical History:  Diagnosis Date  . GERD (gastroesophageal reflux disease)    History reviewed. No pertinent surgical history. Current Outpatient Medications on File Prior to Visit  Medication Sig Dispense Refill  . omeprazole (PRILOSEC) 20 MG capsule Take 1 capsule (20 mg total) by mouth daily. As needed for heartburn 90 capsule 2  . omeprazole (PRILOSEC) 20 MG capsule TAKE 1 CAPSULE (20 MG TOTAL) BY MOUTH DAILY. 90 capsule 2   No current facility-administered medications on file prior to visit.    No Known Allergies Family History  Problem Relation Age of Onset  . Alzheimer's disease  Maternal Grandmother   . Heart disease Maternal Grandfather   . Alzheimer's disease Paternal Grandfather    Social History   Socioeconomic History  . Marital status: Married    Spouse name: None  . Number of children: None  . Years of education: None  . Highest education level: None  Social Needs  . Financial resource strain: None  . Food insecurity - worry: None  . Food insecurity - inability: None  . Transportation needs - medical: None  . Transportation needs - non-medical: None  Occupational History  . Occupation: Engineer, petroleum  Tobacco Use  . Smoking status: Never Smoker  . Smokeless tobacco: Never Used  Substance and Sexual Activity  . Alcohol use: Yes    Alcohol/week: 1.2 - 1.8 oz    Types: 2 - 3 Glasses of wine per week  . Drug use: No  . Sexual activity: Yes  Other Topics Concern  . None  Social History Narrative   Married   Exercise:yes   Depression screen [facility] 2/9 09/11/2017 08/29/2016 01/05/2016 09/07/2015 03/06/2015  Decreased Interest 0 0 0 0 0  Down, Depressed, Hopeless 0 0 0 0 0  PHQ - 2 Score 0 0 0 0 0    Review of Systems  All other systems reviewed and are negative.     Objective:   Physical Exam  Constitutional: He is oriented to person, place, and time. He appears well-developed and well-nourished. No distress.  HENT:  Head: Normocephalic and atraumatic.  Eyes: Conjunctivae and EOM are normal.  Neck: Neck supple. No tracheal  deviation present.  Cardiovascular: Normal rate.  Pulmonary/Chest: Effort normal. No respiratory distress.  Musculoskeletal: Normal range of motion.  Neurological: He is alert and oriented to person, place, and time.  Skin: Skin is warm and dry.  Psychiatric: He has a normal mood and affect. His behavior is normal.  Nursing note and vitals reviewed.  Vitals:   09/11/17 0803  BP: 120/88  Pulse: 71  Resp: 18  Temp: 98.2 F (36.8 C)  TempSrc: Oral  SpO2: 99%  Weight: 234 lb 3.2 oz (106.2 kg)  Height: 5' 8.5" (1.74  m)     Risks/benefits reviewed and verbal informed consent obtained.  Anesthesia with ethyl chloride cold spray. Used scissors and forceps to remove 2 1-22mm pedunculated skin tags from inner upper thigh.  Assessment & Plan:   1. Annual physical exam   2. Routine screening for STI (sexually transmitted infection)   3. Screening for cardiovascular, respiratory, and genitourinary diseases   4. Screening for deficiency anemia   5. Skin tag, acquired - removed 2 tag in right inner upper groin.  6.      Hypertriglyceridemia - ASCVD risk ~7.3% (assuming LDL 154 as it was last yr and likely LDL was high than this) so rec start statin or try tlc and recheck in lab only visit in 3-4 mos.  Orders Placed This Encounter  Procedures  . Comprehensive metabolic panel    Order Specific Question:   Has the patient fasted?    Answer:   Yes  . CBC with Differential/Platelet  . Lipid panel    Order Specific Question:   Has the patient fasted?    Answer:   Yes  . POCT urinalysis dipstick     I personally performed the services described in this documentation, which was scribed in my presence. The recorded information has been reviewed and considered, and addended by me as needed.   Norberto SorensonEva Floria Brandau, M.D.  [facility] at [facility]  [facility] [address] [place] [address] [phone] phone [phone] fax  09/13/17 2:50 PM

## 2017-09-11 NOTE — Patient Instructions (Addendum)
   IF you received an x-ray today, you will receive an invoice from [facility]. Please contact [facility] at [phone] with questions or concerns regarding your invoice.   IF you received labwork today, you will receive an invoice from [facility]. Please contact [facility] at [phone] with questions or concerns regarding your invoice.   Our billing staff will not be able to assist you with questions regarding bills from these companies.  You will be contacted with the lab results as soon as they are available. The fastest way to get your results is to activate your My Chart account. Instructions are located on the last page of this paperwork. If you have not heard from us regarding the results in 2 weeks, please contact this office.     Health Maintenance, Male A healthy lifestyle and preventive care is important for your health and wellness. Ask your health care provider about what schedule of regular examinations is right for you. What should I know about weight and diet? Eat a Healthy Diet  Eat plenty of vegetables, fruits, whole grains, low-fat dairy products, and lean protein.  Do not eat a lot of foods high in solid fats, added sugars, or salt.  Maintain a Healthy Weight Regular exercise can help you achieve or maintain a healthy weight. You should:  Do at least 150 minutes of exercise each week. The exercise should increase your heart rate and make you sweat (moderate-intensity exercise).  Do strength-training exercises at least twice a week.  Watch Your Levels of Cholesterol and Blood Lipids  Have your blood tested for lipids and cholesterol every 5 years starting at 48 years of age. If you are at high risk for heart disease, you should start having your blood tested when you are 48 years old. You may need to have your cholesterol levels checked more often if: ? Your lipid or cholesterol levels are high. ? You are older than 48 years of age. ? You  are at high risk for heart disease.  What should I know about cancer screening? Many types of cancers can be detected early and may often be prevented. Lung Cancer  You should be screened every year for lung cancer if: ? You are a current smoker who has smoked for at least 30 years. ? You are a former smoker who has quit within the past 15 years.  Talk to your health care provider about your screening options, when you should start screening, and how often you should be screened.  Colorectal Cancer  Routine colorectal cancer screening usually begins at 48 years of age and should be repeated every 5-10 years until you are 48 years old. You may need to be screened more often if early forms of precancerous polyps or small growths are found. Your health care provider may recommend screening at an earlier age if you have risk factors for colon cancer.  Your health care provider may recommend using home test kits to check for hidden blood in the stool.  A small camera at the end of a tube can be used to examine your colon (sigmoidoscopy or colonoscopy). This checks for the earliest forms of colorectal cancer.  Prostate and Testicular Cancer  Depending on your age and overall health, your health care provider may do certain tests to screen for prostate and testicular cancer.  Talk to your health care provider about any symptoms or concerns you have about testicular or prostate cancer.  Skin Cancer  Check your skin from   head to toe regularly.  Tell your health care provider about any new moles or changes in moles, especially if: ? There is a change in a mole's size, shape, or color. ? You have a mole that is larger than a pencil eraser.  Always use sunscreen. Apply sunscreen liberally and repeat throughout the day.  Protect yourself by wearing long sleeves, pants, a wide-brimmed hat, and sunglasses when outside.  What should I know about heart disease, diabetes, and high blood  pressure?  If you are 99-107 years of age, have your blood pressure checked every 3-5 years. If you are 13 years of age or older, have your blood pressure checked every year. You should have your blood pressure measured twice-once when you are at a hospital or clinic, and once when you are not at a hospital or clinic. Record the average of the two measurements. To check your blood pressure when you are not at a hospital or clinic, you can use: ? An automated blood pressure machine at a pharmacy. ? A home blood pressure monitor.  Talk to your health care provider about your target blood pressure.  If you are between 64-22 years old, ask your health care provider if you should take aspirin to prevent heart disease.  Have regular diabetes screenings by checking your fasting blood sugar level. ? If you are at a normal weight and have a low risk for diabetes, have this test once every three years after the age of 75. ? If you are overweight and have a high risk for diabetes, consider being tested at a younger age or more often.  A one-time screening for abdominal aortic aneurysm (AAA) by ultrasound is recommended for men aged 13-75 years who are current or former smokers. What should I know about preventing infection? Hepatitis B If you have a higher risk for hepatitis B, you should be screened for this virus. Talk with your health care provider to find out if you are at risk for hepatitis B infection. Hepatitis C Blood testing is recommended for:  Everyone born from [birth year] through 1966.  Anyone with known risk factors for hepatitis C.  Sexually Transmitted Diseases (STDs)  You should be screened each year for STDs including gonorrhea and chlamydia if: ? You are sexually active and are younger than 48 years of age. ? You are older than 48 years of age and your health care provider tells you that you are at risk for this type of infection. ? Your sexual activity has changed since you were last  screened and you are at an increased risk for chlamydia or gonorrhea. Ask your health care provider if you are at risk.  Talk with your health care provider about whether you are at high risk of being infected with HIV. Your health care provider may recommend a prescription medicine to help prevent HIV infection.  What else can I do?  Schedule regular health, dental, and eye exams.  Stay current with your vaccines (immunizations).  Do not use any tobacco products, such as cigarettes, chewing tobacco, and e-cigarettes. If you need help quitting, ask your health care provider.  Limit alcohol intake to no more than 2 drinks per day. One drink equals 12 ounces of beer, 5 ounces of wine, or 1 ounces of hard liquor.  Do not use street drugs.  Do not share needles.  Ask your health care provider for help if you need support or information about quitting drugs.  Tell your health care provider  if you often feel depressed.  Tell your health care provider if you have ever been abused or do not feel safe at home. This information is not intended to replace advice given to you by your health care provider. Make sure you discuss any questions you have with your health care provider. Document Released: 01/18/2008 Document Revised: 03/20/2016 Document Reviewed: 04/25/2015 Elsevier Interactive Patient Education  2018 [employer]  Skin Tag, Adult A skin tag (acrochordon) is a soft, extra growth of skin. Most skin tags are flesh-colored and rarely bigger than a pencil eraser. They commonly form near areas where there are folds in the skin, such as the armpit or groin. Skin tags are not dangerous, and they do not spread from person to person (are not contagious). You may have one skin tag or several. Skin tags do not require treatment. However, your health care provider may recommend removal of a skin tag if it:  Gets irritated from clothing.  Bleeds.  Is visible and unsightly.  Your health care  provider can remove skin tags with a simple surgical procedure or a procedure that involves freezing the skin tag. Follow these instructions at home:  Watch for any changes in your skin tag. A normal skin tag does not require any other special care at home.  Take over-the-counter and prescription medicines only as told by your health care provider.  Keep all follow-up visits as told by your health care provider. This is important. Contact a health care provider if:  You have a skin tag that: ? Becomes painful. ? Changes color. ? Bleeds. ? Swells.  You develop more skin tags. This information is not intended to replace advice given to you by your health care provider. Make sure you discuss any questions you have with your health care provider. Document Released: 08/06/2015 Document Revised: 03/17/2016 Document Reviewed: 08/06/2015 Elsevier Interactive Patient Education  [employer]2018 Elsevier Inc.

## 2017-09-12 LAB — COMPREHENSIVE METABOLIC PANEL
ALBUMIN: 4.8 g/dL (ref 3.5–5.5)
ALK PHOS: 59 IU/L (ref 39–117)
ALT: 46 IU/L — ABNORMAL HIGH (ref 0–44)
AST: 35 IU/L (ref 0–40)
Albumin/Globulin Ratio: 2.2 (ref 1.2–2.2)
BILIRUBIN TOTAL: 0.6 mg/dL (ref 0.0–1.2)
BUN / CREAT RATIO: 14 (ref 9–20)
BUN: 15 mg/dL (ref 6–24)
CO2: 22 mmol/L (ref 20–29)
Calcium: 9.4 mg/dL (ref 8.7–10.2)
Chloride: 102 mmol/L (ref 96–106)
Creatinine, Ser: 1.08 mg/dL (ref 0.76–1.27)
GFR calc Af Amer: 94 mL/min/{1.73_m2} (ref >59)
GFR calc non Af Amer: 81 mL/min/{1.73_m2} (ref >59)
GLOBULIN, TOTAL: 2.2 g/dL (ref 1.5–4.5)
Glucose: 103 mg/dL — ABNORMAL HIGH (ref 65–99)
Potassium: 4.1 mmol/L (ref 3.5–5.2)
Sodium: 141 mmol/L (ref 134–144)
Total Protein: 7 g/dL (ref 6.0–8.5)

## 2017-09-12 LAB — CBC WITH DIFFERENTIAL/PLATELET
BASOS: 0 %
Basophils Absolute: 0 10*3/uL (ref 0.0–0.2)
EOS (ABSOLUTE): 0.1 10*3/uL (ref 0.0–0.4)
Eos: 1 %
HEMOGLOBIN: 14.7 g/dL (ref 13.0–17.7)
Hematocrit: 44.1 % (ref 37.5–51.0)
IMMATURE GRANS (ABS): 0 10*3/uL (ref 0.0–0.1)
IMMATURE GRANULOCYTES: 0 %
LYMPHS: 24 %
Lymphocytes Absolute: 2 10*3/uL (ref 0.7–3.1)
MCH: 29.5 pg (ref 26.6–33.0)
MCHC: 33.3 g/dL (ref 31.5–35.7)
MCV: 89 fL (ref 79–97)
MONOCYTES: 10 %
Monocytes Absolute: 0.8 10*3/uL (ref 0.1–0.9)
NEUTROS ABS: 5.3 10*3/uL (ref 1.4–7.0)
NEUTROS PCT: 65 %
PLATELETS: 201 10*3/uL (ref 150–379)
RBC: 4.98 x10E6/uL (ref 4.14–5.80)
RDW: 14.3 % (ref 12.3–15.4)
WBC: 8.2 10*3/uL (ref 3.4–10.8)

## 2017-09-12 LAB — LIPID PANEL
CHOLESTEROL TOTAL: 297 mg/dL — AB (ref 100–199)
Chol/HDL Ratio: 9.6 ratio — ABNORMAL HIGH (ref 0.0–5.0)
HDL: 31 mg/dL — AB (ref >39)
Triglycerides: 839 mg/dL (ref 0–149)

## 2017-11-04 ENCOUNTER — Other Ambulatory Visit: Payer: Self-pay | Admitting: Family Medicine

## 2017-11-04 DIAGNOSIS — K219 Gastro-esophageal reflux disease without esophagitis: Secondary | ICD-10-CM

## 2018-08-09 ENCOUNTER — Other Ambulatory Visit: Payer: Self-pay | Admitting: Family Medicine

## 2018-08-09 DIAGNOSIS — K219 Gastro-esophageal reflux disease without esophagitis: Secondary | ICD-10-CM

## 2018-08-10 ENCOUNTER — Telehealth: Payer: Self-pay | Admitting: *Deleted

## 2018-08-10 NOTE — Telephone Encounter (Signed)
Provider not accepting new patients at this time.

## 2018-08-10 NOTE — Telephone Encounter (Signed)
Copied from CRM [id number]. Topic: General - Other >> Aug 10, 2018  1:50 PM Elliot Gault wrote: Relation to pt: self  Call back number: [phone]   Reason for call:  Patient would like to establish care with Dr. Patsy Lager he states he use to see her at [facility], please advise

## 2018-08-10 NOTE — Telephone Encounter (Signed)
Requested Prescriptions  Pending Prescriptions Disp Refills  . omeprazole (PRILOSEC) 20 MG capsule [Pharmacy Med Name: OMEPRAZOLE DR 20 MG CAPSULE] 90 capsule 0    Sig: TAKE 1 CAPSULE BY MOUTH EVERY DAY     Gastroenterology: Proton Pump Inhibitors Passed - 08/09/2018  9:22 AM      Passed - Valid encounter within last 12 months    Recent Outpatient Visits          11 months ago Annual physical exam   [facility], Levell July, MD   1 year ago Annual physical exam   [facility], Levell July, MD   2 years ago Headache associated with sexual activity   [facility], Dema Severin, PA-C   2 years ago Annual physical exam   [facility], Levell July, MD   3 years ago Dysuria   [facility], Levell July, MD

## 2018-08-17 NOTE — Telephone Encounter (Signed)
lvm advising patient of message below °

## 2018-09-15 ENCOUNTER — Telehealth: Payer: Self-pay | Admitting: Family Medicine

## 2018-09-15 NOTE — Telephone Encounter (Signed)
Spoke with pt regarding appt scheduled for 2/18 with Dr. Clelia Croft. Due to Dr. Clelia Croft being on leave, pt will need to be rescheduled. I was able to get pt rescheduled for 3/10 at 9:00 am. I advised pt of time, building number and late policy. Pt acknowledged.

## 2018-09-22 ENCOUNTER — Encounter: Payer: BLUE CROSS/BLUE SHIELD | Admitting: Family Medicine

## 2018-10-02 ENCOUNTER — Telehealth: Payer: Self-pay | Admitting: General Practice

## 2018-10-02 NOTE — Telephone Encounter (Signed)
Called pt. And gave Dr. Alver Fisher practice information. Pt. Will call back to reschedule an appt. With the practice.

## 2018-10-13 ENCOUNTER — Encounter: Payer: BLUE CROSS/BLUE SHIELD | Admitting: Family Medicine

## 2018-11-05 ENCOUNTER — Telehealth (INDEPENDENT_AMBULATORY_CARE_PROVIDER_SITE_OTHER): Payer: BLUE CROSS/BLUE SHIELD | Admitting: Family Medicine

## 2018-11-05 ENCOUNTER — Other Ambulatory Visit: Payer: Self-pay

## 2018-11-05 DIAGNOSIS — K219 Gastro-esophageal reflux disease without esophagitis: Secondary | ICD-10-CM

## 2018-11-05 DIAGNOSIS — E781 Pure hyperglyceridemia: Secondary | ICD-10-CM

## 2018-11-05 DIAGNOSIS — E6609 Other obesity due to excess calories: Secondary | ICD-10-CM

## 2018-11-05 DIAGNOSIS — Z6832 Body mass index (BMI) 32.0-32.9, adult: Secondary | ICD-10-CM

## 2018-11-05 MED ORDER — OMEPRAZOLE 20 MG PO CPDR: 20.0000 mg | DELAYED_RELEASE_CAPSULE | Freq: Every day | ORAL | 0 refills | Status: DC | PRN

## 2018-11-05 NOTE — Progress Notes (Signed)
Virtual Visit via telephone Note  I connected with patient on 11/05/18 at 1026 by telephone and verified that I am speaking with the correct person using two identifiers. Zachary Shelton is currently located at home and patient is currently with her during visit. The provider, Myles Lipps, MD is located in their office at time of visit.  I discussed the limitations, risks, security and privacy concerns of performing an evaluation and management service by telephone and the availability of in person appointments. I also discussed with the patient that there may be a patient responsible charge related to this service. The patient expressed understanding and agreed to proceed.  Telephone visit today for followup on GERD and HLP  HPI Last OV 09/2017 - Dr Clelia Croft LDL 127, TG 839, statin vs LFM Patient reports that he has lost 30lbs Today weight 212 lbs Has really changed his diet, eating mostly grilled/baked meats, lots of fruits and veggies, no fried foods, calorie counting, doing noom weight loss program Exercises regularly: was running 5-6 miles in treadmill, now walking 5 miles 3-4 times a week, walks the green when he plays golf, getting a minimum 10,000 steps a day Needs omeprazole prn, has not needed for a while, would like to still have rx Drinks "a little bit", mostly drinks during the weekend, about 4 bud lites, occ a glass of wine with dinner   Wt Readings from Last 3 Encounters:  09/11/17 234 lb 3.2 oz (106.2 kg)  08/29/16 219 lb 12.8 oz (99.7 kg)  01/09/16 237 lb (107.5 kg)    Lab Results  Component Value Date   CHOL 297 (H) 09/11/2017   HDL 31 (L) 09/11/2017   LDLCALC Comment 09/11/2017   LDLDIRECT 127 09/07/2015   TRIG 839 (HH) 09/11/2017   CHOLHDL 9.6 (H) 09/11/2017  ?  Fall Risk  09/11/2017 08/29/2016 01/05/2016 09/07/2015  Falls in the past year? No No No No     Depression screen [facility] 2/9 09/11/2017 08/29/2016 01/05/2016  Decreased Interest 0 0 0  Down, Depressed,  Hopeless 0 0 0  PHQ - 2 Score 0 0 0    No Known Allergies  Prior to Admission medications   Medication Sig Start Date End Date Taking? Authorizing Provider  omeprazole (PRILOSEC) 20 MG capsule TAKE 1 CAPSULE BY MOUTH EVERY DAY 08/10/18   Sherren Mocha, MD    Past Medical History:  Diagnosis Date  . GERD (gastroesophageal reflux disease)     No past surgical history on file.  Social History   Tobacco Use  . Smoking status: Never Smoker  . Smokeless tobacco: Never Used  Substance Use Topics  . Alcohol use: Yes    Alcohol/week: 2.0 - 3.0 standard drinks    Types: 2 - 3 Glasses of wine per week    Family History  Problem Relation Age of Onset  . Alzheimer's disease Maternal Grandmother   . Heart disease Maternal Grandfather   . Alzheimer's disease Paternal Grandfather     ROS Per hpi  Objective  Vitals as reported by the patient: as above  There were no vitals filed for this visit.  ASSESSMENT and PLAN  1. Hypertriglyceridemia 2. Class 1 obesity due to excess calories without serious comorbidity with body mass index (BMI) of 32.0 to 32.9 in adult Rechecking labs today. Congratulated patient on significant LFM, continue with these. As long as lipid improving, will not start medication. Discussed RTC precautions.   3. Gastroesophageal reflux disease, esophagitis presence not specified Intermittent,  much improved with LFM. Omeprazole prn refilled.  - omeprazole (PRILOSEC) 20 MG capsule; Take 1 capsule (20 mg total) by mouth daily as needed.  FOLLOW-UP: within 1 week for fasting labs, otherwise in 1 year   The above assessment and management plan was discussed with the patient. The patient verbalized understanding of and has agreed to the management plan. Patient is aware to call the clinic if symptoms persist or worsen. Patient is aware when to return to the clinic for a follow-up visit. Patient educated on when it is appropriate to go to the emergency department.     I provided 12 minutes of non-face-to-face time during this encounter.  Myles Lipps, MD Primary Care at [facility] [address] [place], [place] [address] Ph.  [phone] Fax [phone]

## 2018-11-12 ENCOUNTER — Ambulatory Visit (INDEPENDENT_AMBULATORY_CARE_PROVIDER_SITE_OTHER): Payer: BLUE CROSS/BLUE SHIELD | Admitting: Family Medicine

## 2018-11-12 ENCOUNTER — Other Ambulatory Visit: Payer: Self-pay

## 2018-11-12 DIAGNOSIS — E781 Pure hyperglyceridemia: Secondary | ICD-10-CM

## 2018-11-13 LAB — LIPID PANEL
Chol/HDL Ratio: 4.8 ratio (ref 0.0–5.0)
Cholesterol, Total: 183 mg/dL (ref 100–199)
HDL: 38 mg/dL — ABNORMAL LOW (ref >39)
LDL Calculated: 110 mg/dL — ABNORMAL HIGH (ref 0–99)
Triglycerides: 175 mg/dL — ABNORMAL HIGH (ref 0–149)
VLDL Cholesterol Cal: 35 mg/dL (ref 5–40)

## 2018-11-13 LAB — CMP14+EGFR
ALT: 26 IU/L (ref 0–44)
AST: 20 IU/L (ref 0–40)
Albumin/Globulin Ratio: 3.4 — ABNORMAL HIGH (ref 1.2–2.2)
Albumin: 4.8 g/dL (ref 4.0–5.0)
Alkaline Phosphatase: 53 IU/L (ref 39–117)
BUN/Creatinine Ratio: 15 (ref 9–20)
BUN: 16 mg/dL (ref 6–24)
Bilirubin Total: 0.6 mg/dL (ref 0.0–1.2)
CO2: 23 mmol/L (ref 20–29)
Calcium: 9.1 mg/dL (ref 8.7–10.2)
Chloride: 102 mmol/L (ref 96–106)
Creatinine, Ser: 1.05 mg/dL (ref 0.76–1.27)
GFR calc Af Amer: 96 mL/min/{1.73_m2} (ref >59)
GFR calc non Af Amer: 83 mL/min/{1.73_m2} (ref >59)
Globulin, Total: 1.4 g/dL — ABNORMAL LOW (ref 1.5–4.5)
Glucose: 88 mg/dL (ref 65–99)
Potassium: 4.6 mmol/L (ref 3.5–5.2)
Sodium: 140 mmol/L (ref 134–144)
Total Protein: 6.2 g/dL (ref 6.0–8.5)

## 2018-11-18 ENCOUNTER — Encounter: Payer: Self-pay | Admitting: Family Medicine

## 2018-11-26 ENCOUNTER — Encounter: Payer: Self-pay | Admitting: Family Medicine

## 2018-12-30 DIAGNOSIS — Z711 Person with feared health complaint in whom no diagnosis is made: Secondary | ICD-10-CM | POA: Diagnosis not present

## 2018-12-30 DIAGNOSIS — Z20828 Contact with and (suspected) exposure to other viral communicable diseases: Secondary | ICD-10-CM | POA: Diagnosis not present

## 2019-01-04 DIAGNOSIS — Z20828 Contact with and (suspected) exposure to other viral communicable diseases: Secondary | ICD-10-CM | POA: Diagnosis not present

## 2019-01-31 ENCOUNTER — Other Ambulatory Visit: Payer: Self-pay | Admitting: Family Medicine

## 2019-01-31 DIAGNOSIS — K219 Gastro-esophageal reflux disease without esophagitis: Secondary | ICD-10-CM

## 2019-01-31 NOTE — Telephone Encounter (Signed)
Requested Prescriptions  Pending Prescriptions Disp Refills  . omeprazole (PRILOSEC) 20 MG capsule [Pharmacy Med Name: OMEPRAZOLE DR 20 MG CAPSULE] 90 capsule 0    Sig: TAKE 1 CAPSULE (20 MG TOTAL) BY MOUTH DAILY AS NEEDED.     Gastroenterology: Proton Pump Inhibitors Failed - 01/31/2019  9:26 AM      Failed - Valid encounter within last 12 months    Recent Outpatient Visits          2 months ago Hypertriglyceridemia   [facility], Lilia Argue, MD   1 year ago Annual physical exam   [facility], Laurey Arrow, MD   2 years ago Annual physical exam   [facility], Laurey Arrow, MD   3 years ago Headache associated with sexual activity   Primary Care at [place], PA-C   3 years ago Annual physical exam   [facility], Laurey Arrow, MD      Future Appointments            In 9 months Rutherford Guys, MD Primary Care at [place], [facility]

## 2019-04-25 ENCOUNTER — Other Ambulatory Visit: Payer: Self-pay | Admitting: Family Medicine

## 2019-04-25 DIAGNOSIS — K219 Gastro-esophageal reflux disease without esophagitis: Secondary | ICD-10-CM

## 2019-07-27 ENCOUNTER — Other Ambulatory Visit: Payer: Self-pay | Admitting: Family Medicine

## 2019-07-27 DIAGNOSIS — K219 Gastro-esophageal reflux disease without esophagitis: Secondary | ICD-10-CM

## 2019-09-02 DIAGNOSIS — Z03818 Encounter for observation for suspected exposure to other biological agents ruled out: Secondary | ICD-10-CM | POA: Diagnosis not present

## 2019-10-11 ENCOUNTER — Telehealth: Payer: Self-pay

## 2019-10-11 NOTE — Telephone Encounter (Signed)
Pt crm closed, no action needed at this time

## 2019-11-08 ENCOUNTER — Ambulatory Visit (INDEPENDENT_AMBULATORY_CARE_PROVIDER_SITE_OTHER): Payer: BC Managed Care – PPO | Admitting: Family Medicine

## 2019-11-08 ENCOUNTER — Encounter: Payer: Self-pay | Admitting: Family Medicine

## 2019-11-08 ENCOUNTER — Other Ambulatory Visit: Payer: Self-pay

## 2019-11-08 VITALS — BP 118/85 | HR 55 | Temp 98.1°F | Ht 68.5 in | Wt 239.4 lb

## 2019-11-08 DIAGNOSIS — Z125 Encounter for screening for malignant neoplasm of prostate: Secondary | ICD-10-CM | POA: Diagnosis not present

## 2019-11-08 DIAGNOSIS — Z13228 Encounter for screening for other metabolic disorders: Secondary | ICD-10-CM | POA: Diagnosis not present

## 2019-11-08 DIAGNOSIS — Z13220 Encounter for screening for lipoid disorders: Secondary | ICD-10-CM | POA: Diagnosis not present

## 2019-11-08 DIAGNOSIS — Z0000 Encounter for general adult medical examination without abnormal findings: Secondary | ICD-10-CM

## 2019-11-08 DIAGNOSIS — Z1211 Encounter for screening for malignant neoplasm of colon: Secondary | ICD-10-CM

## 2019-11-08 DIAGNOSIS — L301 Dyshidrosis [pompholyx]: Secondary | ICD-10-CM

## 2019-11-08 MED ORDER — TRIAMCINOLONE ACETONIDE 0.1 % EX CREA: 1.0000 "application " | TOPICAL_CREAM | Freq: Two times a day (BID) | CUTANEOUS | 2 refills | Status: DC

## 2019-11-08 NOTE — Patient Instructions (Addendum)
If you have lab work done today you will be contacted with your lab results within the next 2 weeks.  If you have not heard from [place] then please contact us. The fastest way to get your results is to register for My Chart.   IF you received an x-ray today, you will receive an invoice from [facility]. Please contact [facility] at [phone] with questions or concerns regarding your invoice.   IF you received labwork today, you will receive an invoice from [facility]. Please contact [facility] at [phone] with questions or concerns regarding your invoice.   Our billing staff will not be able to assist you with questions regarding bills from these companies.  You will be contacted with the lab results as soon as they are available. The fastest way to get your results is to activate your My Chart account. Instructions are located on the last page of this paperwork. If you have not heard from [place] regarding the results in 2 weeks, please contact this office.     Preventive Care 50-26 Years Old, Male Preventive care refers to lifestyle choices and visits with your health care provider that can promote health and wellness. This includes:  A yearly physical exam. This is also called an annual well check.  Regular dental and eye exams.  Immunizations.  Screening for certain conditions.  Healthy lifestyle choices, such as eating a healthy diet, getting regular exercise, not using drugs or products that contain nicotine and tobacco, and limiting alcohol use. What can I expect for my preventive care visit? Physical exam Your health care provider will check:  Height and weight. These may be used to calculate body mass index (BMI), which is a measurement that tells if you are at a healthy weight.  Heart rate and blood pressure.  Your skin for abnormal spots. Counseling Your health care provider may ask you questions about:  Alcohol, tobacco, and drug use.  Emotional  well-being.  Home and relationship well-being.  Sexual activity.  Eating habits.  Work and work Statistician. What immunizations do I need?  Influenza (flu) vaccine  This is recommended every year. Tetanus, diphtheria, and pertussis (Tdap) vaccine  You may need a Td booster every 10 years. Varicella (chickenpox) vaccine  You may need this vaccine if you have not already been vaccinated. Zoster (shingles) vaccine  You may need this after age 70. Measles, mumps, and rubella (MMR) vaccine  You may need at least one dose of MMR if you were born in 1957 or later. You may also need a second dose. Pneumococcal conjugate (PCV13) vaccine  You may need this if you have certain conditions and were not previously vaccinated. Pneumococcal polysaccharide (PPSV23) vaccine  You may need one or two doses if you smoke cigarettes or if you have certain conditions. Meningococcal conjugate (MenACWY) vaccine  You may need this if you have certain conditions. Hepatitis A vaccine  You may need this if you have certain conditions or if you travel or work in places where you may be exposed to hepatitis A. Hepatitis B vaccine  You may need this if you have certain conditions or if you travel or work in places where you may be exposed to hepatitis B. Haemophilus influenzae type b (Hib) vaccine  You may need this if you have certain risk factors. Human papillomavirus (HPV) vaccine  If recommended by your health care provider, you may need three doses over 6 months. You may receive vaccines as individual doses  or as more than one vaccine together in one shot (combination vaccines). Talk with your health care provider about the risks and benefits of combination vaccines. What tests do I need? Blood tests  Lipid and cholesterol levels. These may be checked every 5 years, or more frequently if you are over 50 years old.  Hepatitis C test.  Hepatitis B test. Screening  Lung cancer screening.  You may have this screening every year starting at age 50 if you have a 30-pack-year history of smoking and currently smoke or have quit within the past 15 years.  Prostate cancer screening. Recommendations will vary depending on your family history and other risks.  Colorectal cancer screening. All adults should have this screening starting at age 63 and continuing until age 34. Your health care provider may recommend screening at age 43 if you are at increased risk. You will have tests every 1-10 years, depending on your results and the type of screening test.  Diabetes screening. This is done by checking your blood sugar (glucose) after you have not eaten for a while (fasting). You may have this done every 1-3 years.  Sexually transmitted disease (STD) testing. Follow these instructions at home: Eating and drinking  Eat a diet that includes fresh fruits and vegetables, whole grains, lean protein, and low-fat dairy products.  Take vitamin and mineral supplements as recommended by your health care provider.  Do not drink alcohol if your health care provider tells you not to drink.  If you drink alcohol: ? Limit how much you have to 0-2 drinks a day. ? Be aware of how much alcohol is in your drink. In the [place] one drink equals one 12 oz bottle of beer (355 mL), one 5 oz glass of wine (148 mL), or one 1 oz glass of hard liquor (44 mL). Lifestyle  Take daily care of your teeth and gums.  Stay active. Exercise for at least 30 minutes on 5 or more days each week.  Do not use any products that contain nicotine or tobacco, such as cigarettes, e-cigarettes, and chewing tobacco. If you need help quitting, ask your health care provider.  If you are sexually active, practice safe sex. Use a condom or other form of protection to prevent STIs (sexually transmitted infections).  Talk with your health care provider about taking a low-dose aspirin every day starting at age 70. What's next?  Go  to your health care provider once a year for a well check visit.  Ask your health care provider how often you should have your eyes and teeth checked.  Stay up to date on all vaccines. This information is not intended to replace advice given to you by your health care provider. Make sure you discuss any questions you have with your health care provider. Document Revised: 07/16/2018 Document Reviewed: 07/16/2018 Elsevier Patient Education  2020 [employer].

## 2019-11-08 NOTE — Progress Notes (Signed)
4/5/20218:11 AM  Cathie Hoops [birth year], 49 y.o., male [id number]  Chief Complaint  Patient presents with  . Annual Exam    HPI:   Patient is a 49 y.o. male with past medical history significant for GERD, elevated TG who presents today for CPE  Colorectal Cancer Screening: due today, cologuard Prostate Cancer Screening: due today HIV Screening:2017 STI Screening: declines Seasonal Influenza Vaccination: declines Had covid in feb 2021 Td/Tdap Vaccination: 2014 Pneumococcal Vaccination: at age 2 Zoster Vaccination: considering Frequency of Dental evaluation: Q6 months Frequency of Eye evaluation: > 1 year Plays golf (walks), walks his dog everyday, not running Diet could be better Fasting today  Depression screen [facility] 2/9 11/08/2019 09/11/2017 08/29/2016  Decreased Interest 0 0 0  Down, Depressed, Hopeless 0 0 0  PHQ - 2 Score 0 0 0    Fall Risk  11/08/2019 09/11/2017 08/29/2016 01/05/2016 09/07/2015  Falls in the past year? 0 No No No No  Number falls in past yr: 0 - - - -  Injury with Fall? 0 - - - -     No Known Allergies  Prior to Admission medications   Medication Sig Start Date End Date Taking? Authorizing Provider  omeprazole (PRILOSEC) 20 MG capsule TAKE 1 CAPSULE (20 MG TOTAL) BY MOUTH DAILY AS NEEDED. 07/27/19  Yes Rutherford Guys, MD    Past Medical History:  Diagnosis Date  . GERD (gastroesophageal reflux disease)     No past surgical history on file.  Social History   Tobacco Use  . Smoking status: Never Smoker  . Smokeless tobacco: Never Used  Substance Use Topics  . Alcohol use: Yes    Alcohol/week: 2.0 - 3.0 standard drinks    Types: 2 - 3 Glasses of wine per week    Family History  Problem Relation Age of Onset  . Alzheimer's disease Maternal Grandmother   . Heart disease Maternal Grandfather   . Alzheimer's disease Paternal Grandfather     Review of Systems  Constitutional: Negative for chills and fever.  Respiratory: Negative for  cough and shortness of breath.   Cardiovascular: Negative for chest pain, palpitations and leg swelling.  Gastrointestinal: Negative for abdominal pain, blood in stool, melena, nausea and vomiting.  Genitourinary: Negative for frequency and urgency.  Musculoskeletal: Negative for joint pain and myalgias.  Skin: Positive for rash.  Neurological: Negative for dizziness, tingling and headaches.  Psychiatric/Behavioral: Negative for depression. The patient is not nervous/anxious and does not have insomnia.   All other systems reviewed and are negative.    OBJECTIVE:  Today's Vitals   11/08/19 0813  BP: 118/85  Pulse: (!) 55  Temp: 98.1 F (36.7 C)  SpO2: 96%  Weight: 239 lb 6.4 oz (108.6 kg)  Height: 5' 8.5" (1.74 m)   Body mass index is 35.87 kg/m.   Wt Readings from Last 3 Encounters:  11/08/19 239 lb 6.4 oz (108.6 kg)  09/11/17 234 lb 3.2 oz (106.2 kg)  08/29/16 219 lb 12.8 oz (99.7 kg)     Physical Exam Vitals and nursing note reviewed.  Constitutional:      Appearance: He is well-developed.  HENT:     Head: Normocephalic and atraumatic.     Right Ear: Hearing, tympanic membrane, ear canal and external ear normal.     Left Ear: Hearing, tympanic membrane, ear canal and external ear normal.     Mouth/Throat:     Pharynx: No oropharyngeal exudate.  Eyes:     Conjunctiva/sclera: Conjunctivae  normal.     Pupils: Pupils are equal, round, and reactive to light.  Neck:     Thyroid: No thyromegaly.  Cardiovascular:     Rate and Rhythm: Normal rate and regular rhythm.     Heart sounds: Normal heart sounds. No murmur. No friction rub. No gallop.   Pulmonary:     Effort: Pulmonary effort is normal.     Breath sounds: Normal breath sounds. No wheezing, rhonchi or rales.  Abdominal:     General: Bowel sounds are normal. There is no distension.     Palpations: Abdomen is soft. There is no mass.     Tenderness: There is no abdominal tenderness.  Musculoskeletal:         General: Normal range of motion.     Cervical back: Neck supple.     Right lower leg: No edema.     Left lower leg: No edema.  Lymphadenopathy:     Cervical: No cervical adenopathy.  Skin:    General: Skin is warm and dry.  Neurological:     Mental Status: He is alert and oriented to person, place, and time.     Cranial Nerves: No cranial nerve deficit.     Coordination: Coordination normal.     Gait: Gait normal.     Deep Tendon Reflexes: Reflexes are normal and symmetric.  Psychiatric:        Mood and Affect: Mood normal.        Behavior: Behavior normal.       Hearing Screening   125Hz 250Hz 500Hz 1000Hz 2000Hz 3000Hz 4000Hz 6000Hz 8000Hz  Right ear:           Left ear:             Visual Acuity Screening   Right eye Left eye Both eyes  Without correction: 20/20 20/20 20/30  With correction:       No results found for this or any previous visit (from the past 24 hour(s)).  No results found.   ASSESSMENT and PLAN  1. Annual physical exam Routine HCM labs ordered. HCM reviewed/discussed. Anticipatory guidance regarding healthy weight, lifestyle and choices given.    2. Special screening for malignant neoplasms, colon - Cologuard  3. Screening for metabolic disorder - UUV25+DGUY  4. Screening for lipid disorders - Lipid panel  5. Screening for prostate cancer - PSA  6. Dyshidrotic eczema Discussed supportive measures, new meds r/se/b and RTC precautions.  - triamcinolone cream (KENALOG) 0.1 %; Apply 1 application topically 2 (two) times daily.  Return in about 1 year (around 11/07/2020).    Rutherford Guys, MD Primary [facility] at [facility], [place] [address] Ph.  [phone] Fax [phone]

## 2019-11-09 LAB — CMP14+EGFR
ALT: 69 IU/L — ABNORMAL HIGH (ref 0–44)
AST: 41 IU/L — ABNORMAL HIGH (ref 0–40)
Albumin/Globulin Ratio: 2.2 (ref 1.2–2.2)
Albumin: 4.9 g/dL (ref 4.0–5.0)
Alkaline Phosphatase: 60 IU/L (ref 39–117)
BUN/Creatinine Ratio: 16 (ref 9–20)
BUN: 15 mg/dL (ref 6–24)
Bilirubin Total: 0.5 mg/dL (ref 0.0–1.2)
CO2: 24 mmol/L (ref 20–29)
Calcium: 9.1 mg/dL (ref 8.7–10.2)
Chloride: 100 mmol/L (ref 96–106)
Creatinine, Ser: 0.96 mg/dL (ref 0.76–1.27)
GFR calc Af Amer: 106 mL/min/{1.73_m2} (ref >59)
GFR calc non Af Amer: 92 mL/min/{1.73_m2} (ref >59)
Globulin, Total: 2.2 g/dL (ref 1.5–4.5)
Glucose: 107 mg/dL — ABNORMAL HIGH (ref 65–99)
Potassium: 4.4 mmol/L (ref 3.5–5.2)
Sodium: 137 mmol/L (ref 134–144)
Total Protein: 7.1 g/dL (ref 6.0–8.5)

## 2019-11-09 LAB — LIPID PANEL
Chol/HDL Ratio: 9 ratio — ABNORMAL HIGH (ref 0.0–5.0)
Cholesterol, Total: 278 mg/dL — ABNORMAL HIGH (ref 100–199)
HDL: 31 mg/dL — ABNORMAL LOW (ref >39)
LDL Chol Calc (NIH): 110 mg/dL — ABNORMAL HIGH (ref 0–99)
Triglycerides: 768 mg/dL (ref 0–149)
VLDL Cholesterol Cal: 137 mg/dL — ABNORMAL HIGH (ref 5–40)

## 2019-11-09 LAB — PSA: Prostate Specific Ag, Serum: 0.8 ng/mL (ref 0.0–4.0)

## 2019-11-28 ENCOUNTER — Other Ambulatory Visit: Payer: Self-pay | Admitting: Family Medicine

## 2019-11-28 DIAGNOSIS — E781 Pure hyperglyceridemia: Secondary | ICD-10-CM

## 2019-11-28 MED ORDER — ROSUVASTATIN CALCIUM 10 MG PO TABS: 10.0000 mg | ORAL_TABLET | Freq: Every day | ORAL | 0 refills | Status: DC

## 2020-07-26 DIAGNOSIS — J101 Influenza due to other identified influenza virus with other respiratory manifestations: Secondary | ICD-10-CM | POA: Diagnosis not present

## 2020-07-26 DIAGNOSIS — Z20828 Contact with and (suspected) exposure to other viral communicable diseases: Secondary | ICD-10-CM | POA: Diagnosis not present

## 2020-07-26 DIAGNOSIS — B974 Respiratory syncytial virus as the cause of diseases classified elsewhere: Secondary | ICD-10-CM | POA: Diagnosis not present

## 2020-07-26 DIAGNOSIS — U071 COVID-19: Secondary | ICD-10-CM | POA: Diagnosis not present

## 2020-11-09 ENCOUNTER — Encounter: Payer: BC Managed Care – PPO | Admitting: Family Medicine

## 2020-11-09 DIAGNOSIS — Z23 Encounter for immunization: Secondary | ICD-10-CM | POA: Diagnosis not present

## 2020-12-25 DIAGNOSIS — Z23 Encounter for immunization: Secondary | ICD-10-CM | POA: Diagnosis not present

## 2021-07-03 DIAGNOSIS — J209 Acute bronchitis, unspecified: Secondary | ICD-10-CM | POA: Diagnosis not present

## 2021-10-10 ENCOUNTER — Ambulatory Visit (INDEPENDENT_AMBULATORY_CARE_PROVIDER_SITE_OTHER): Payer: BC Managed Care – PPO | Admitting: Family Medicine

## 2021-10-10 ENCOUNTER — Encounter: Payer: Self-pay | Admitting: Family Medicine

## 2021-10-10 VITALS — BP 123/75 | HR 64 | Ht 68.5 in | Wt 227.4 lb

## 2021-10-10 DIAGNOSIS — E781 Pure hyperglyceridemia: Secondary | ICD-10-CM

## 2021-10-10 DIAGNOSIS — F439 Reaction to severe stress, unspecified: Secondary | ICD-10-CM

## 2021-10-10 DIAGNOSIS — E669 Obesity, unspecified: Secondary | ICD-10-CM | POA: Diagnosis not present

## 2021-10-10 LAB — COMPREHENSIVE METABOLIC PANEL
ALT: 35 U/L (ref 0–53)
AST: 26 U/L (ref 0–37)
Albumin: 4.8 g/dL (ref 3.5–5.2)
Alkaline Phosphatase: 49 U/L (ref 39–117)
BUN: 18 mg/dL (ref 6–23)
CO2: 30 mEq/L (ref 19–32)
Calcium: 9.3 mg/dL (ref 8.4–10.5)
Chloride: 103 mEq/L (ref 96–112)
Creatinine, Ser: 0.94 mg/dL (ref 0.40–1.50)
GFR: 93.49 mL/min (ref >60.00)
Glucose, Bld: 95 mg/dL (ref 70–99)
Potassium: 4.5 mEq/L (ref 3.5–5.1)
Sodium: 138 mEq/L (ref 135–145)
Total Bilirubin: 0.7 mg/dL (ref 0.2–1.2)
Total Protein: 6.8 g/dL (ref 6.0–8.3)

## 2021-10-10 LAB — CBC
HCT: 43.9 % (ref 39.0–52.0)
Hemoglobin: 14.7 g/dL (ref 13.0–17.0)
MCHC: 33.6 g/dL (ref 30.0–36.0)
MCV: 89.8 fl (ref 78.0–100.0)
Platelets: 195 10*3/uL (ref 150.0–400.0)
RBC: 4.89 Mil/uL (ref 4.22–5.81)
RDW: 12.9 % (ref 11.5–15.5)
WBC: 7.9 10*3/uL (ref 4.0–10.5)

## 2021-10-10 LAB — LIPID PANEL
Cholesterol: 261 mg/dL — ABNORMAL HIGH (ref 0–200)
HDL: 42.7 mg/dL (ref >39.00)
NonHDL: 217.83
Total CHOL/HDL Ratio: 6
Triglycerides: 265 mg/dL — ABNORMAL HIGH (ref 0.0–149.0)
VLDL: 53 mg/dL — ABNORMAL HIGH (ref 0.0–40.0)

## 2021-10-10 LAB — TSH: TSH: 2.38 u[IU]/mL (ref 0.35–5.50)

## 2021-10-10 LAB — LDL CHOLESTEROL, DIRECT: Direct LDL: 152 mg/dL

## 2021-10-10 LAB — HEMOGLOBIN A1C: Hgb A1c MFr Bld: 5.8 % (ref 4.6–6.5)

## 2021-10-10 NOTE — Progress Notes (Signed)
? ?______________________________________________________________________ ? ?HPI ?Zachary Shelton is a 51 y.o. male presenting to [facility] Primary Care at [facility] today to establish care.  ? ?Patient Care Team: ?Lezlie Lye, Meda Coffee, MD as PCP - General (Family Medicine) ? ?Health Maintenance  ?Topic Date Due  ? Hepatitis C Screening: USPSTF Recommendation to screen - Ages 70-79 yo.  Never done  ? Colon Cancer Screening  Never done  ? Zoster (Shingles) Vaccine (1 of 2) Never done  ? COVID-19 Vaccine (3 - Booster for Pfizer series) 02/19/2021  ? Flu Shot  11/02/2021*  ? Tetanus Vaccine  08/05/2022  ? HIV Screening  Completed  ? HPV Vaccine  Aged Out  ?*Topic was postponed. The date shown is not the original due date.  ? ? ? ?Concerns today: ?HLD/Hypertriglyceridemia: Last PCP recommended he start taking medication, but he hasn't started anything yet. Thinks he has a family history of high cholesterol. He has not had any major events. Has not seen a provider in the past 2 years. He works as a Surveyor, minerals. Diet is <2400 calories/day, tries to eat healthy and avoid processed foods; drinks mostly water; no alcohol in January, otherwise about 6-10 beers a week. Smokes cigars occasionally, maybe once a week. Exercises daily - weights/cardio. Denies any chest pain, palpitations, dyspnea, weight gain, edema. 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 He does not feel like he needs any interventions at this time. Denies SI/HI.  ? ? ?Patient Active Problem List  ? Diagnosis Date Noted  ? Hypertriglyceridemia 09/23/2013  ? Obesity 09/23/2013  ? GERD (gastroesophageal reflux disease) 09/03/2012  ? ? ?PHQ9 Today: ?Depression screen [facility] 2/9 10/10/2021 11/08/2019 09/11/2017  ?Decreased Interest 0 0 0  ?Down, Depressed, Hopeless 0 0 0  ?PHQ - 2 Score 0 0 0  ?Altered sleeping 1 - -  ?Tired, decreased energy 0 - -  ?Change in appetite 0 - -  ?Feeling  bad or failure about yourself  1 - -  ?Trouble concentrating 0 - -  ?Moving slowly or fidgety/restless 0 - -  ?Suicidal thoughts 0 - -  ?PHQ-9 Score 2 - -  ?Difficult doing work/chores Not difficult at all - -  ? ?GAD7 Today: ?GAD 7 : Generalized Anxiety Score 10/10/2021  ?Nervous, Anxious, on Edge 1  ?Control/stop worrying 2  ?Worry too much - different things 1  ?Trouble relaxing 0  ?Restless 0  ?Easily annoyed or irritable 0  ?Afraid - awful might happen 0  ?Total GAD 7 Score 4  ?Anxiety Difficulty Not difficult at all  ? ?______________________________________________________________________ ?PMH ?Past Medical History:  ?Diagnosis Date  ? GERD (gastroesophageal reflux disease)   ? ? ?ROS ?All review of systems negative except what is listed in the HPI ? ?PHYSICAL EXAM ?Physical Exam ?Vitals reviewed.  ?Constitutional:   ?   Appearance: Normal appearance. He is obese.  ?Cardiovascular:  ?   Rate and Rhythm: Normal rate and regular rhythm.  ?   Pulses: Normal pulses.  ?   Heart sounds: Normal heart sounds.  ?Pulmonary:  ?   Effort: Pulmonary effort is normal.  ?   Breath sounds: Normal breath sounds.  ?Skin: ?   General: Skin is warm and dry.  ?Neurological:  ?   General: No focal deficit present.  ?   Mental Status: He is alert and oriented to person, place, and time.  ?Psychiatric:     ?   Mood and  Affect: Mood normal.     ?   Behavior: Behavior normal.     ?   Thought Content: Thought content normal.     ?   Judgment: Judgment normal.  ? ?______________________________________________________________________ ?ASSESSMENT AND PLAN ? ?1. Hypertriglyceridemia ?Updating fasting labs today.  ?- Hemoglobin A1c ?- CBC ?- Comprehensive metabolic panel ?- Lipid panel ?- TSH ? ?2. Obesity (BMI 30.0-34.9) ?Continue heart healthy diet, weight management, regular exercise.  ?- Hemoglobin A1c ?- CBC ?- Comprehensive metabolic panel ?- Lipid panel ?- TSH ? ?3. Stress ?No SI/HI. States he feels like he is managing well; declines  intervention at this time. Patient aware of signs/symptoms requiring further/urgent evaluation. ? ? ?Establish care ?Education provided today during visit and on AVS for patient to review at home.  ?Diet and Exercise recommendations provided.  ?Current diagnoses and recommendations discussed. ?HM recommendations reviewed with recommendations.  ? ? ?Outpatient Encounter Medications as of 10/10/2021  ?Medication Sig  ? omeprazole (PRILOSEC) 20 MG capsule TAKE 1 CAPSULE (20 MG TOTAL) BY MOUTH DAILY AS NEEDED.  ? [DISCONTINUED] rosuvastatin (CRESTOR) 10 MG tablet Take 1 tablet (10 mg total) by mouth daily.  ? [DISCONTINUED] triamcinolone cream (KENALOG) 0.1 % Apply 1 application topically 2 (two) times daily.  ? ?No facility-administered encounter medications on file as of 10/10/2021.  ? ? ?Return for pending results. ? ? ? ?Lollie Marrow Reola Calkins, DNP, FNP-C ? ? ?

## 2021-10-10 NOTE — Patient Instructions (Signed)
Thank you for choosing [facility] at [facility] for your Primary Care needs. I am excited for the opportunity to partner with you to meet your health care goals. It was a pleasure meeting you today! ° ° ° °Information on diet, exercise, and health maintenance recommendations are listed below. This is information to help you be sure you are on track for optimal health and monitoring.  ° °Please look over this and let us know if you have any questions or if you have completed any of the health maintenance outside of [facility] so that we can be sure your records are up to date.  °___________________________________________________________ ° °MyChart:  °For all urgent or time sensitive needs we ask that you please call the office to avoid delays. Our number is [phone]. °MyChart is not constantly monitored and due to the large volume of messages a day, replies may take up to 72 business hours. ° °MyChart Policy: °MyChart allows for you to see your visit notes, after visit summary, provider recommendations, lab and tests results, make an appointment, request refills, and contact your provider or the office for non-urgent questions or concerns. Providers are seeing patients during normal business hours and do not have built in time to review MyChart messages.  °We ask that you allow a minimum of 3 business days for responses to MyChart messages. For this reason, please do not send urgent requests through MyChart. Please call the office at [phone]. °New and ongoing conditions may require a visit. We have virtual and in-person visits available for your convenience.  °Complex MyChart concerns may require a visit. Your provider may request you schedule a virtual or in-person visit to ensure we are providing the best care possible. °MyChart messages sent after 11:00 AM on Friday will not be received by the provider until Monday morning.  °  °Lab and Test Results: °You will receive your lab and  test results on MyChart as soon as they are completed and results have been sent by the lab or testing facility. Due to this service, you will receive your results BEFORE your provider.  °I review lab and test results each morning prior to seeing patients. Some results require collaboration with other providers to ensure you are receiving the most appropriate care. For this reason, we ask that you please allow a minimum of 3-5 business days from the time that ALL results have been received for your provider to receive and review lab and test results and contact you about these.  °Most lab and test result comments from the provider will be sent through MyChart. Your provider may recommend changes to the plan of care, follow-up visits, repeat testing, ask questions, or request an office visit to discuss these results. You may reply directly to this message or call the office to provide information for the provider or set up an appointment. °In some instances, you will be called with test results and recommendations. Please let us know if this is preferred and we will make note of this in your chart to provide this for you.    °If you have not heard a response to your lab or test results in 5 business days from all results returning to MyChart, please call the office to let us know. We ask that you please avoid calling prior to this time unless there is an emergent concern. Due to high call volumes, this can delay the resulting process. ° °After Hours: °For all non-emergency after hours   needs, please call the office at [phone] and select the option to reach the on-call  service. On-call services are shared between multiple [facility] offices and therefore it will not be possible to speak directly with your provider. On-call providers may provide medical advice and recommendations, but are unable to provide refills for maintenance medications.  °For all emergency or urgent medical needs after normal business  hours, we recommend that you seek care at the closest Urgent Care or Emergency Department to ensure appropriate treatment in a timely manner.  °[facility] has a 24 hour emergency room located on the ground floor for your convenience.  ° °Urgent Concerns During the Business Day °Providers are seeing patients from 8AM to 5PM with a busy schedule and are most often not able to respond to non-urgent calls until the end of the day or the next business day. °If you should have URGENT concerns during the day, please call and speak to the nurse or schedule a same day appointment so that we can address your concern without delay.  ° °Thank you, again, for choosing me as your health care partner. I appreciate your trust and look forward to learning more about you.  ° °Darianny Momon B. Runa Whittingham, DNP, FNP-C ° °___________________________________________________________ ° °Health Maintenance Recommendations °Screening Testing °Mammogram °Every 1-2 years based on history and risk factors °Starting at age 50 °Pap Smear °Ages 21-39 every 3 years °Ages 30-65 every 5 years with HPV testing °More frequent testing may be required based on results and history °Colon Cancer Screening °Every 1-10 years based on test performed, risk factors, and history °Starting at age 45 °Bone Density Screening °Every 2-10 years based on history °Starting at age 65 for women °Recommendations for men differ based on medication usage, history, and risk factors °AAA Screening °One time ultrasound °Men 65-75 years old who have ever smoked °Lung Cancer Screening °Low Dose Lung CT every 12 months °Age 50-80 years with a 20 pack-year smoking history who still smoke or who have quit within the last 15 years ° °Screening Labs °Routine  Labs: Complete Blood Count (CBC), Complete Metabolic Panel (CMP), Cholesterol (Lipid Panel) °Every 6-12 months based on history and medications °May be recommended more frequently based on current conditions or  previous results °Hemoglobin A1c Lab °Every 3-12 months based on history and previous results °Starting at age 45 or earlier with diagnosis of diabetes, high cholesterol, BMI >26, and/or risk factors °Frequent monitoring for patients with diabetes to ensure blood sugar control °Thyroid Panel (TSH w/ T3 & T4) °Every 6 months based on history, symptoms, and risk factors °May be repeated more often if on medication °HIV °One time testing for all patients 13 and older °May be repeated more frequently for patients with increased risk factors or exposure °Hepatitis C °One time testing for all patients 18 and older °May be repeated more frequently for patients with increased risk factors or exposure °Gonorrhea, Chlamydia °Every 12 months for all sexually active persons 13-24 years °Additional monitoring may be recommended for those who are considered high risk or who have symptoms °PSA °Men 40-54 years old with risk factors °Additional screening may be recommended from age 55-69 based on risk factors, symptoms, and history ° °Vaccine Recommendations °Tetanus Booster °All adults every 10 years °Flu Vaccine °All patients 6 months and older every year °COVID Vaccine °All patients 12 years and older °Initial dosing with booster °May recommend additional booster based on age and health history °HPV Vaccine °2 doses all   patients age 9-26 °Dosing may be considered for patients over 26 °Shingles Vaccine (Shingrix) °2 doses all adults 50 years and older °Pneumonia (Pneumovax 23) °All adults 65 years and older °May recommend earlier dosing based on health history °Pneumonia (Prevnar 13) °All adults 65 years and older °Dosed 1 year after Pneumovax 23 °Pneumonia (Prevnar 20) °All adults 65 years and older (adults 19-64 with certain conditions or risk factors) °1 dose  °For those who have no received Prevnar 13 vaccine previously ° ° °Additional Screening, Testing, and Vaccinations may be recommended on an individualized basis based on  family history, health history, risk factors, and/or exposure.  °__________________________________________________________ ° °Diet Recommendations for All Patients ° °I recommend that all patients maintain a diet low in saturated fats, carbohydrates, and cholesterol. While this can be challenging at first, it is not impossible and small changes can make big differences.  °Things to try: °Decreasing the amount of soda, sweet tea, and/or juice to one or less per day and replace with water °While water is always the first choice, if you do not like water you may consider °adding a water additive without sugar to improve the taste °other sugar free drinks °Replace potatoes with a brightly colored vegetable at dinner °Use healthy oils, such as canola oil or olive oil, instead of butter or hard margarine °Limit your bread intake to two pieces or less a day °Replace regular pasta with low carb pasta options °Bake, broil, or grill foods instead of frying °Monitor portion sizes  °Eat smaller, more frequent meals throughout the day instead of large meals ° °An important thing to remember is, if you love foods that are not great for your health, you don't have to give them up completely. Instead, allow these foods to be a reward when you have done well. Allowing yourself to still have special treats every once in a while is a nice way to tell yourself thank you for working hard to keep yourself healthy.  ° °Also remember that every day is a new day. If you have a bad day and "fall off the wagon", you can still climb right back up and keep moving along on your journey! ° °We have resources available to help you!  °Some websites that may be helpful include: °www.MyPlate.gov  °Www.VeryWellFit.com °_____________________________________________________________ ° °Activity Recommendations for All Patients ° °I recommend that all adults get at least 20 minutes of moderate physical activity that elevates your heart rate at least 5  days out of the week.  °Some examples include: °Walking or jogging at a pace that allows you to carry on a conversation °Cycling (stationary bike or outdoors) °Water aerobics °Yoga °Weight lifting °Dancing °If physical limitations prevent you from putting stress on your joints, exercise in a pool or seated in a chair are excellent options. ° °Do determine your MAXIMUM heart rate for activity: YOUR AGE - 220 = MAX HeartRate  ° °Remember! °Do not push yourself too hard.  °Start slowly and build up your pace, speed, weight, time in exercise, etc.  °Allow your body to rest between exercise and get good sleep. °You will need more water than normal when you are exerting yourself. Do not wait until you are thirsty to drink. Drink with a purpose of getting in at least 8, 8 ounce glasses of water a day plus more depending on how much you exercise and sweat.  ° ° °If you begin to develop dizziness, chest pain, abdominal pain, jaw pain, shortness of breath, headache,   vision changes, lightheadedness, or other concerning symptoms, stop the activity and allow your body to rest. If your symptoms are severe, seek emergency evaluation immediately. If your symptoms are concerning, but not severe, please let us know so that we can recommend further evaluation.  ° ° ° °

## 2022-03-06 DIAGNOSIS — F99 Mental disorder, not otherwise specified: Secondary | ICD-10-CM | POA: Diagnosis not present

## 2022-03-13 DIAGNOSIS — F99 Mental disorder, not otherwise specified: Secondary | ICD-10-CM | POA: Diagnosis not present

## 2022-03-19 DIAGNOSIS — F99 Mental disorder, not otherwise specified: Secondary | ICD-10-CM | POA: Diagnosis not present

## 2022-03-26 DIAGNOSIS — F99 Mental disorder, not otherwise specified: Secondary | ICD-10-CM | POA: Diagnosis not present

## 2022-04-02 DIAGNOSIS — F99 Mental disorder, not otherwise specified: Secondary | ICD-10-CM | POA: Diagnosis not present

## 2022-04-16 DIAGNOSIS — F99 Mental disorder, not otherwise specified: Secondary | ICD-10-CM | POA: Diagnosis not present

## 2022-04-24 DIAGNOSIS — F99 Mental disorder, not otherwise specified: Secondary | ICD-10-CM | POA: Diagnosis not present

## 2022-05-01 DIAGNOSIS — F99 Mental disorder, not otherwise specified: Secondary | ICD-10-CM | POA: Diagnosis not present

## 2022-05-15 DIAGNOSIS — F99 Mental disorder, not otherwise specified: Secondary | ICD-10-CM | POA: Diagnosis not present

## 2022-05-30 DIAGNOSIS — F99 Mental disorder, not otherwise specified: Secondary | ICD-10-CM | POA: Diagnosis not present

## 2022-06-17 DIAGNOSIS — F99 Mental disorder, not otherwise specified: Secondary | ICD-10-CM | POA: Diagnosis not present

## 2022-07-03 DIAGNOSIS — F99 Mental disorder, not otherwise specified: Secondary | ICD-10-CM | POA: Diagnosis not present

## 2022-07-17 DIAGNOSIS — F99 Mental disorder, not otherwise specified: Secondary | ICD-10-CM | POA: Diagnosis not present
